# Patient Record
Sex: Female | Born: 1980 | Race: Black or African American | Hispanic: No | Marital: Married | Smoking: Never smoker
Health system: Southern US, Community
[De-identification: ages and names within clinical notes are randomized; demographics above are authoritative.]

## PROBLEM LIST (undated history)

## (undated) ENCOUNTER — Emergency Department (HOSPITAL_COMMUNITY): Admission: EM | Payer: Managed Care, Other (non HMO) | Source: Home / Self Care

## (undated) DIAGNOSIS — M549 Dorsalgia, unspecified: Secondary | ICD-10-CM

## (undated) DIAGNOSIS — D219 Benign neoplasm of connective and other soft tissue, unspecified: Secondary | ICD-10-CM

## (undated) DIAGNOSIS — Z789 Other specified health status: Secondary | ICD-10-CM

## (undated) HISTORY — DX: Benign neoplasm of connective and other soft tissue, unspecified: D21.9

---

## 2001-01-29 ENCOUNTER — Inpatient Hospital Stay (HOSPITAL_COMMUNITY): Admission: AD | Admit: 2001-01-29 | Discharge: 2001-01-31 | Payer: Self-pay | Admitting: *Deleted

## 2002-12-02 ENCOUNTER — Ambulatory Visit (HOSPITAL_COMMUNITY): Admission: RE | Admit: 2002-12-02 | Discharge: 2002-12-02 | Payer: Self-pay | Admitting: *Deleted

## 2003-01-12 ENCOUNTER — Ambulatory Visit (HOSPITAL_COMMUNITY): Admission: RE | Admit: 2003-01-12 | Discharge: 2003-01-12 | Payer: Self-pay | Admitting: *Deleted

## 2003-03-22 ENCOUNTER — Inpatient Hospital Stay (HOSPITAL_COMMUNITY): Admission: AD | Admit: 2003-03-22 | Discharge: 2003-03-24 | Payer: Self-pay | Admitting: *Deleted

## 2007-04-23 ENCOUNTER — Inpatient Hospital Stay (HOSPITAL_COMMUNITY): Admission: AD | Admit: 2007-04-23 | Discharge: 2007-04-23 | Payer: Self-pay | Admitting: Obstetrics & Gynecology

## 2007-04-26 ENCOUNTER — Inpatient Hospital Stay (HOSPITAL_COMMUNITY): Admission: AD | Admit: 2007-04-26 | Discharge: 2007-04-27 | Payer: Self-pay | Admitting: Obstetrics & Gynecology

## 2007-05-03 ENCOUNTER — Inpatient Hospital Stay (HOSPITAL_COMMUNITY): Admission: AD | Admit: 2007-05-03 | Discharge: 2007-05-03 | Payer: Self-pay | Admitting: Obstetrics & Gynecology

## 2007-07-04 ENCOUNTER — Encounter: Admission: RE | Admit: 2007-07-04 | Discharge: 2007-07-04 | Payer: Self-pay | Admitting: Sports Medicine

## 2007-08-06 ENCOUNTER — Ambulatory Visit (HOSPITAL_COMMUNITY): Admission: RE | Admit: 2007-08-06 | Discharge: 2007-08-06 | Payer: Self-pay | Admitting: Obstetrics & Gynecology

## 2007-08-07 ENCOUNTER — Encounter: Admission: RE | Admit: 2007-08-07 | Discharge: 2007-08-08 | Payer: Self-pay | Admitting: Obstetrics & Gynecology

## 2007-09-09 ENCOUNTER — Ambulatory Visit (HOSPITAL_COMMUNITY): Admission: RE | Admit: 2007-09-09 | Discharge: 2007-09-09 | Payer: Self-pay | Admitting: Obstetrics & Gynecology

## 2007-11-27 ENCOUNTER — Ambulatory Visit (HOSPITAL_COMMUNITY): Admission: RE | Admit: 2007-11-27 | Discharge: 2007-11-27 | Payer: Self-pay | Admitting: Obstetrics & Gynecology

## 2008-04-14 ENCOUNTER — Inpatient Hospital Stay (HOSPITAL_COMMUNITY): Admission: AD | Admit: 2008-04-14 | Discharge: 2008-04-18 | Payer: Self-pay | Admitting: Obstetrics

## 2008-04-15 ENCOUNTER — Encounter: Payer: Self-pay | Admitting: Obstetrics

## 2009-01-19 ENCOUNTER — Emergency Department (HOSPITAL_COMMUNITY): Admission: EM | Admit: 2009-01-19 | Discharge: 2009-01-19 | Payer: Self-pay | Admitting: Family Medicine

## 2009-01-19 ENCOUNTER — Emergency Department (HOSPITAL_COMMUNITY): Admission: EM | Admit: 2009-01-19 | Discharge: 2009-01-19 | Payer: Self-pay | Admitting: Emergency Medicine

## 2009-03-28 ENCOUNTER — Ambulatory Visit: Payer: Self-pay | Admitting: Internal Medicine

## 2009-03-30 ENCOUNTER — Ambulatory Visit (HOSPITAL_COMMUNITY): Admission: RE | Admit: 2009-03-30 | Discharge: 2009-03-30 | Payer: Self-pay | Admitting: Family Medicine

## 2009-04-20 ENCOUNTER — Encounter (INDEPENDENT_AMBULATORY_CARE_PROVIDER_SITE_OTHER): Payer: Self-pay | Admitting: Family Medicine

## 2009-04-20 ENCOUNTER — Ambulatory Visit: Payer: Self-pay | Admitting: Internal Medicine

## 2009-04-20 LAB — CONVERTED CEMR LAB: Sed Rate: 9 mm/hr (ref 0–22)

## 2009-05-19 ENCOUNTER — Ambulatory Visit: Payer: Self-pay | Admitting: Obstetrics and Gynecology

## 2009-05-26 ENCOUNTER — Ambulatory Visit (HOSPITAL_COMMUNITY): Admission: RE | Admit: 2009-05-26 | Discharge: 2009-05-26 | Payer: Self-pay | Admitting: Obstetrics and Gynecology

## 2009-07-21 ENCOUNTER — Ambulatory Visit: Payer: Self-pay | Admitting: Obstetrics and Gynecology

## 2009-11-10 ENCOUNTER — Encounter (INDEPENDENT_AMBULATORY_CARE_PROVIDER_SITE_OTHER): Payer: Self-pay | Admitting: *Deleted

## 2009-11-10 LAB — CONVERTED CEMR LAB
CO2: 24 meq/L (ref 19–32)
CRP: 0.2 mg/dL (ref <0.6)
Calcium: 10 mg/dL (ref 8.4–10.5)
Chloride: 104 meq/L (ref 96–112)
Creatinine, Ser: 0.69 mg/dL (ref 0.40–1.20)
Eosinophils Absolute: 0 10*3/uL (ref 0.0–0.7)
Eosinophils Relative: 1 % (ref 0–5)
Glucose, Bld: 90 mg/dL (ref 70–99)
HCT: 37.2 % (ref 36.0–46.0)
Lymphs Abs: 1.3 10*3/uL (ref 0.7–4.0)
MCV: 82.9 fL (ref 78.0–100.0)
Monocytes Relative: 11 % (ref 3–12)
RBC: 4.49 M/uL (ref 3.87–5.11)
Sodium: 137 meq/L (ref 135–145)
Total Bilirubin: 0.3 mg/dL (ref 0.3–1.2)
Total Protein: 7.4 g/dL (ref 6.0–8.3)
WBC: 3.4 10*3/uL — ABNORMAL LOW (ref 4.0–10.5)

## 2010-03-03 ENCOUNTER — Ambulatory Visit (HOSPITAL_COMMUNITY)
Admission: RE | Admit: 2010-03-03 | Discharge: 2010-03-03 | Payer: Self-pay | Source: Home / Self Care | Attending: Family Medicine | Admitting: Family Medicine

## 2010-03-05 ENCOUNTER — Encounter: Payer: Self-pay | Admitting: Obstetrics & Gynecology

## 2010-03-06 ENCOUNTER — Encounter: Payer: Self-pay | Admitting: Obstetrics & Gynecology

## 2010-05-16 LAB — COMPREHENSIVE METABOLIC PANEL
BUN: 13 mg/dL (ref 6–23)
CO2: 26 mEq/L (ref 19–32)
Calcium: 9.3 mg/dL (ref 8.4–10.5)
Creatinine, Ser: 0.56 mg/dL (ref 0.4–1.2)
GFR calc non Af Amer: >60 mL/min (ref >60)
Glucose, Bld: 86 mg/dL (ref 70–99)
Total Protein: 7.5 g/dL (ref 6.0–8.3)

## 2010-05-16 LAB — CBC
HCT: 37.8 % (ref 36.0–46.0)
Hemoglobin: 13.2 g/dL (ref 12.0–15.0)
MCHC: 35 g/dL (ref 30.0–36.0)
MCV: 88.2 fL (ref 78.0–100.0)
RBC: 4.28 MIL/uL (ref 3.87–5.11)
RDW: 12.7 % (ref 11.5–15.5)

## 2010-05-16 LAB — POCT URINALYSIS DIP (DEVICE)
Glucose, UA: NEGATIVE mg/dL
Nitrite: NEGATIVE
Specific Gravity, Urine: 1.025 (ref 1.005–1.030)
Urobilinogen, UA: 0.2 mg/dL (ref 0.0–1.0)

## 2010-05-16 LAB — DIFFERENTIAL
Eosinophils Absolute: 0 10*3/uL (ref 0.0–0.7)
Lymphs Abs: 1.5 10*3/uL (ref 0.7–4.0)
Monocytes Relative: 11 % (ref 3–12)
Neutro Abs: 1.7 10*3/uL (ref 1.7–7.7)
Neutrophils Relative %: 46 % (ref 43–77)

## 2010-05-16 LAB — WET PREP, GENITAL: Trich, Wet Prep: NONE SEEN

## 2010-05-16 LAB — GC/CHLAMYDIA PROBE AMP, GENITAL
Chlamydia, DNA Probe: NEGATIVE
GC Probe Amp, Genital: NEGATIVE

## 2010-05-16 LAB — URINALYSIS, ROUTINE W REFLEX MICROSCOPIC
Bilirubin Urine: NEGATIVE
Hgb urine dipstick: NEGATIVE
Urobilinogen, UA: 0.2 mg/dL (ref 0.0–1.0)

## 2010-05-16 LAB — URINE CULTURE

## 2010-05-16 LAB — LIPASE, BLOOD: Lipase: 16 U/L (ref 11–59)

## 2010-05-16 LAB — POCT PREGNANCY, URINE: Preg Test, Ur: NEGATIVE

## 2010-05-25 LAB — CBC
Hemoglobin: 11.1 g/dL — ABNORMAL LOW (ref 12.0–15.0)
MCV: 91.2 fL (ref 78.0–100.0)
Platelets: 135 10*3/uL — ABNORMAL LOW (ref 150–400)
RBC: 2.69 MIL/uL — ABNORMAL LOW (ref 3.87–5.11)
RDW: 13.9 % (ref 11.5–15.5)
WBC: 6.1 10*3/uL (ref 4.0–10.5)

## 2010-06-27 NOTE — Op Note (Signed)
[name], Walls                ACCOUNT NO.:  [id number]   MEDICAL RECORD NO.:  [id number]          PATIENT TYPE:  INP   LOCATION:  9128                          FACILITY:  [facility]   PHYSICIAN:  Charles A. Clearance Coots, M.D.DATE OF BIRTH:  05-16-1980   DATE OF PROCEDURE:  04/14/2008  DATE OF DISCHARGE:                               OPERATIVE REPORT   PREOPERATIVE DIAGNOSIS:  Arrest of descent, persistent occiput  posterior, severe variables.   POSTOPERATIVE DIAGNOSIS:  Arrest of descent, persistent occiput  posterior, severe variables.   PROCEDURE:  Primary low transverse cesarean section.   SURGEON:  Charles A. Clearance Coots, MD   ANESTHESIA:  Epidural.   ESTIMATED BLOOD LOSS:  1000 mL.   IV FLUIDS:  2100 mL.   URINE OUTPUT:  175 mL.   COMPLICATIONS:  None.   Foley to gravity.   FINDINGS:  Viable female at 0258, Apgars of 9 at 1 minute and 9 at 5  minutes, weight of 7 pounds and 3 ounces.  Normal uterus, ovaries, and  fallopian tubes.   OPERATION:  The patient was brought to the operating room after  satisfactory redosing of the epidural.  The abdomen was prepped and  draped in the usual sterile fashion.  Pfannenstiel skin incision was  made with a scalpel that was deepened down to the fascia with a scalpel.  The fascia was nicked in the midline and the fascial incision was  extended to left and to a right with curved Mayo scissors.  The superior  and inferior fascial edges were taken off the rectus muscle both blunt  and sharp dissection.  The rectus muscle was bluntly divided in the  midline.  Peritoneum was entered digitally and was digitally extended to  the left and to right.  The bladder blade was positioned in the  incision.  The vesicouterine fold of peritoneum above the reflection of  the urinary bladder was grasped with forceps and was incised and  undermined with Metzenbaum scissors.  The incision was extended to the  left and to the right with the Metzenbaum scissors.   The bladder flap  was bluntly developed and the bladder blade was repositioned in front of  the urinary bladder placing well out of operative field.  The uterus was  entered transversely in the lower uterine segment with a scalpel.  Clear  amniotic fluid was expelled.  The uterine incision was extended to the  left and to the right digitally.  The occiput was noted to be right  occiput posterior and the occiput was rotated anteriorly into the  incision and the delivery was accomplished with the aid of fundal  pressure from the assistant.  The infant's mouth and nose was suctioned  with a suction bulb and the delivery was completed with the aid of  fundal pressure from the assistant.  The umbilical cord was doubly  clamped and cut and infant was handed off to the nursery staff.  Cord  blood was obtained and the placenta was spontaneously expelled from the  uterine cavity intact.  The endometrial surface was thoroughly debrided  with a dry lap sponge.  The edges of the uterine incision were grasped  with ring forceps.  The uterus was closed in 2 layers, the first layer  was closed with a continuous interlocking suture of 0 Monocryl from each  corner to the center, second layer was closed with a continuous  imbricating suture of 0 Monocryl.  Hemostasis was excellent.  The pelvic  cavity was thoroughly irrigated with warm saline solution and all clots  were removed.  The abdomen was then closed as follows.  The parietal  peritoneum was closed with continuous suture of 2-0 Vicryl.  The fascia  was closed with continuous suture of 0 Vicryl.  Subcutaneous tissue was  thoroughly irrigated with warm saline solution and all areas of  subcutaneous bleeding were coagulated with the Bovie.  Skin was then  closed with surgical stainless steel staples.  Sterile bandage was  applied to the incision closure.  Surgical technician indicated that all  needle, sponge, and instrument counts were correct x2.   The patient  tolerated the procedure well, transported to the recovery room in  satisfactory condition.      Charles A. Clearance Coots, M.D.  Electronically Signed     CAH/MEDQ  D:  04/15/2008  T:  04/15/2008  Job:  [id number]

## 2010-06-27 NOTE — Discharge Summary (Signed)
[name], Walls                ACCOUNT NO.:  [id number]   MEDICAL RECORD NO.:  [id number]          PATIENT TYPE:  INP   LOCATION:  9128                          FACILITY:  [facility]   PHYSICIAN:  Charles A. Clearance Coots, M.D.DATE OF BIRTH:  October 23, 1980   DATE OF ADMISSION:  04/14/2008  DATE OF DISCHARGE:  04/18/2008                               DISCHARGE SUMMARY   ADMITTING DIAGNOSES:  1. 39 weeks' gestation.  2. Premature rupture of membranes.  3. Induction of labor.   DISCHARGE DIAGNOSES:  1. 39 weeks' gestation.  2. Premature rupture of membranes.  3. Induction of labor.  4. Status post primary low transverse cesarean section on April 15, 2008, for arrest of descent, persistent occiput posterior position,      and severe variable fetal heart rate deceleration.  Viable female      delivered at 0258, Apgars of 9 at 1 minute and 9 at 5 minutes,      weight of 7 pounds and 3 ounces.  Mother and infant discharged home      in good condition.   REASON FOR ADMISSION:  A 30 year old G5, P3, estimated date of  confinement of April 20, 2008, presents with complaint of leaking fluid  which was clear.  The patient had no uterine contractions.  Prenatal  care was remarkable for positive group B strep.   PAST MEDICAL HISTORY:  Surgery; none.  Illnesses; none.   MEDICATIONS:  Prenatal vitamins.   ALLERGIES:  No known drug allergies   SOCIAL HISTORY:  Married.  Negative tobacco, alcohol, or recreational  drug use.   FAMILY HISTORY:  Positive for cardiovascular disease.   PHYSICAL EXAMINATION:  GENERAL:  Well-nourished, well-developed female  in no acute distress.  VITAL SIGNS:  Afebrile, vital signs were stable.  LUNGS:  Clear to auscultation bilaterally.  HEART:  Regular rate and rhythm.  ABDOMEN:  Gravid, nontender.  Cervix 1 cm dilated, 50% effaced, and  vertex -2 station.   ADMITTING LABORATORY DATA:  Hemoglobin 11.1, hematocrit 32.5, white  blood cell count 4700, platelets  135,000.   HOSPITAL COURSE:  The patient was admitted and started on Pitocin for  induction of labor.  She progressed to full dilatation.  The vertex at a  0 station and occiput posterior position.  The patient made no further  descent of the vertex for greater than 3 hours with adequate labor.  The  fetal heart rate pattern also revealed severe variable fetal heart rate  decelerations.  A decision was made to proceed with cesarean section  delivery for arrest of descent and persistent occiput posterior position  and severe variable fetal heart rate decels.  Primary low transverse  cesarean section was performed on April 15, 2008.  There were no  intraoperative complications.  Postoperative course was complicated by  anemia.  The patient had no significant clinical hemodynamic adverse  changes and did not need transfusion of blood products and therefore  started on iron therapy to be continued postpartum.  The patient did  well postoperatively and was discharged home on postop  day #3 in good  condition.   DISCHARGE LABORATORY DATA:  Hemoglobin 8.5, hematocrit 24.5, white blood  cell count 6100, platelets 109,000.   DISCHARGE DISPOSITION:  Medications; Percocet and ibuprofen was  prescribed for pain.  Continue prenatal vitamins.  Iron was prescribed  for anemia.  Routine written instructions were given for discharge after  cesarean section.  The patient is to call office for followup  appointment in 2 weeks.      Charles A. Clearance Coots, M.D.  Electronically Signed     CAH/MEDQ  D:  05/24/2008  T:  05/24/2008  Job:  [id number]

## 2010-11-06 LAB — POCT PREGNANCY, URINE
Operator id: 12087
Preg Test, Ur: POSITIVE

## 2010-11-06 LAB — HCG, QUANTITATIVE, PREGNANCY: hCG, Beta Chain, Quant, S: 227 — ABNORMAL HIGH

## 2010-11-06 LAB — WET PREP, GENITAL
Trich, Wet Prep: NONE SEEN
Yeast Wet Prep HPF POC: NONE SEEN

## 2011-02-23 ENCOUNTER — Emergency Department (HOSPITAL_COMMUNITY)
Admission: EM | Admit: 2011-02-23 | Discharge: 2011-02-23 | Disposition: A | Discharge status: Home / Self Care | Payer: Self-pay | Source: Home / Self Care | Attending: Emergency Medicine | Admitting: Emergency Medicine

## 2011-02-23 ENCOUNTER — Encounter (HOSPITAL_COMMUNITY): Payer: Self-pay

## 2011-02-23 DIAGNOSIS — M549 Dorsalgia, unspecified: Secondary | ICD-10-CM

## 2011-02-23 DIAGNOSIS — G8929 Other chronic pain: Secondary | ICD-10-CM

## 2011-02-23 HISTORY — DX: Dorsalgia, unspecified: M54.9

## 2011-02-23 LAB — POCT URINALYSIS DIP (DEVICE)
Bilirubin Urine: NEGATIVE
Glucose, UA: NEGATIVE mg/dL
Hgb urine dipstick: NEGATIVE
Specific Gravity, Urine: >=1.03 (ref 1.005–1.030)
Urobilinogen, UA: 0.2 mg/dL (ref 0.0–1.0)

## 2011-02-23 MED ORDER — TRAMADOL HCL 50 MG PO TABS: 50.0000 mg | ORAL_TABLET | Freq: Four times a day (QID) | ORAL | Status: AC | PRN

## 2011-02-23 MED ORDER — MELOXICAM 7.5 MG PO TABS: 7.5000 mg | ORAL_TABLET | Freq: Every day | ORAL | Status: DC

## 2011-02-23 NOTE — ED Provider Notes (Signed)
History     CSN: [id number]  Arrival date & time 02/23/11  1241   First MD Initiated Contact with Patient 02/23/11 1321      Chief Complaint  Patient presents with  . Back Pain    (Consider location/radiation/quality/duration/timing/severity/associated sxs/prior treatment) HPI Comments: Low back pain  that radiates to left leg" going on for 5 years, "on and off" No weakness. No numbness or tingling , does describe shooting pains shoots down to leg" No weight loss No fevers  Have not been using contraception, also is concerned she might be pregnant"  Patient is a 31 y.o. female presenting with back pain.  Back Pain  This is a chronic problem. The problem occurs constantly. The problem has not changed since onset.The pain is associated with no known injury. The pain is present in the lumbar spine. The pain does not radiate. The pain is at a severity of 8/10. The symptoms are aggravated by bending and certain positions. Pertinent negatives include no chest pain, no fever, no numbness, no weight loss, no headaches, no abdominal pain, no abdominal swelling, no bowel incontinence, no perianal numbness, no bladder incontinence, no dysuria, no pelvic pain, no paresthesias and no weakness. She has tried heat for the symptoms. The treatment provided no relief.    Past Medical History  Diagnosis Date  . Back pain     Past Surgical History  Procedure Date  . Cesarean section     History reviewed. No pertinent family history.  History  Substance Use Topics  . Smoking status: Not on file  . Smokeless tobacco: Not on file  . Alcohol Use:     OB History    Grav Para Term Preterm Abortions TAB SAB Ect Mult Living                  Review of Systems  Constitutional: Negative for fever, chills, weight loss and appetite change.  HENT: Negative for neck stiffness.   Respiratory: Negative for shortness of breath.   Cardiovascular: Negative for chest pain.  Gastrointestinal: Negative  for abdominal pain and bowel incontinence.  Genitourinary: Negative for bladder incontinence, dysuria, frequency, flank pain, vaginal bleeding, difficulty urinating and pelvic pain.  Musculoskeletal: Positive for back pain. Negative for joint swelling.  Neurological: Negative for weakness, numbness, headaches and paresthesias.    Allergies  Review of patient's allergies indicates no known allergies.  Home Medications  No current outpatient prescriptions on file.  BP 116/81  Pulse 80  Temp(Src) 98.1 F (36.7 C) (Oral)  Resp 18  SpO2 100%  LMP 02/07/2011  Physical Exam  Nursing note and vitals reviewed. Constitutional: She appears well-developed and well-nourished. No distress.  Musculoskeletal: She exhibits tenderness.       Lumbar back: She exhibits decreased range of motion, tenderness, bony tenderness and pain. She exhibits no edema, no deformity and no laceration.       Back:  Neurological: She is alert.  Skin: No rash noted. No erythema.    ED Course  Procedures (including critical care time)  Labs Reviewed  POCT URINALYSIS DIP (DEVICE) - Abnormal; Notable for the following:    Leukocytes, UA TRACE (*) Biochemical Testing Only. Please order routine urinalysis from main lab if confirmatory testing is needed.   All other components within normal limits  POCT PREGNANCY, URINE  POCT PREGNANCY, URINE  POCT URINALYSIS DIPSTICK   No results found.   No diagnosis found.    MDM  Chronic back pain x 5 years- MRI  2011 by [facility]- no neurological or surgical pathology.  Has not followed up. No fevers, exam- was not indicative of cauda equina syndrome, or malignancy of infectious stigmata.        Jimmie Molly, MD 02/23/11 [phone]

## 2011-02-23 NOTE — ED Notes (Signed)
C/o of lt sided mid and low back pain that radiates down the left leg.  States she has had sx for 5 years but worse recently and more constant.  States she has hx of "disc problems".

## 2011-06-18 ENCOUNTER — Other Ambulatory Visit: Payer: Self-pay

## 2011-07-11 ENCOUNTER — Other Ambulatory Visit: Payer: Self-pay | Admitting: Nurse Practitioner

## 2011-07-25 ENCOUNTER — Other Ambulatory Visit: Payer: Self-pay | Admitting: Nurse Practitioner

## 2011-07-25 ENCOUNTER — Ambulatory Visit (HOSPITAL_COMMUNITY)
Admission: RE | Admit: 2011-07-25 | Discharge: 2011-07-25 | Disposition: A | Discharge status: Home / Self Care | Payer: Medicaid Other | Source: Ambulatory Visit | Attending: Nurse Practitioner | Admitting: Nurse Practitioner

## 2011-07-25 ENCOUNTER — Encounter (HOSPITAL_COMMUNITY): Payer: Self-pay

## 2011-07-25 DIAGNOSIS — Z1389 Encounter for screening for other disorder: Secondary | ICD-10-CM | POA: Insufficient documentation

## 2011-07-25 DIAGNOSIS — O30009 Twin pregnancy, unspecified number of placenta and unspecified number of amniotic sacs, unspecified trimester: Secondary | ICD-10-CM

## 2011-07-25 DIAGNOSIS — Z09 Encounter for follow-up examination after completed treatment for conditions other than malignant neoplasm: Secondary | ICD-10-CM

## 2011-07-25 DIAGNOSIS — O30002 Twin pregnancy, unspecified number of placenta and unspecified number of amniotic sacs, second trimester: Secondary | ICD-10-CM

## 2011-07-25 DIAGNOSIS — O34219 Maternal care for unspecified type scar from previous cesarean delivery: Secondary | ICD-10-CM | POA: Insufficient documentation

## 2011-07-25 DIAGNOSIS — Z363 Encounter for antenatal screening for malformations: Secondary | ICD-10-CM | POA: Insufficient documentation

## 2011-07-25 DIAGNOSIS — O358XX0 Maternal care for other (suspected) fetal abnormality and damage, not applicable or unspecified: Secondary | ICD-10-CM | POA: Insufficient documentation

## 2011-07-25 DIAGNOSIS — R011 Cardiac murmur, unspecified: Secondary | ICD-10-CM

## 2011-07-25 NOTE — Progress Notes (Signed)
MFM note  [name] is a 31 yo [id number] currently at 71 4/7 weeks with DC/DA twins who is referred for ultrasound and evaluation due to newly diagnosed "heart murmur".  She reports that she has never been told in the past of any heart murmurs or abnormalities.  She reports frequent episodes of palpiations, but denies chest pain, shortness of breath, or orthopnea. She is able to go about her normal/ daily activity without symptoms.  She is without complaints today.  POB hx Term C-section for NR fetal tracing (2010)  Early SAB at 6 weeks (2009)  Term SVD without complications in 1999, 2002, 2005  PMH - neg  PSH- C-section  Meds - Prenatal vitamins  Social - non drinker, non smoker, denies illicit drug use  ROS non contributory except as per HPI  VS: Wt: 172#, BP 101/65, pulse 100 WDWN gravid female in NAD Lungs - CTA Cor - RRR, grade II/VI systolic ejection murmur  Ultrasound: DC/DA twin gestation with best dates of 16 4/7 weeks.  A thick dividing membrane is noted with a twin peak sign.  Growth is appropriate and concordant. Normal amniotic fluid volume x 2.  A: breech, appears to be female, right/ lateral placenta.  A velamentous cord insertion is noted.  Limited views of the fetal heart were obtained due to early gestational age.  B: transverse, femaile, posterior placenta.  A margianl cord instertion noted.  Limited views of the heart and spine were obtained.  Assessment: 1) DC/DA twin gestation at 29 4/7 weeks 2) Systolic murmur - likely innocent flow murmur  Plan: 1) Recommend checking TSH/ free T4 secondary to "palpitations" 2) Cardiology consult for maternal echocardiogram and possible Holter monitor due to palpitations - feel that the murmur is most likely non-pathologic, but given immigrant status would recommend work up to rule out Rheumatic heard disease 3) Follow up ultrasound for growth and to reevaluate fetal anatomy in 4 weeks.  Thank you for the opportunity to  participate in this patient's care.  Please do not hesitate to contact our office if there are any further questions or concerns.  Alpha Gula, MD  I spent approximately 30 minutes with this patient with over 50% of time spent in face-to-face counseling.

## 2011-08-10 ENCOUNTER — Encounter (HOSPITAL_COMMUNITY): Payer: Self-pay

## 2011-08-10 ENCOUNTER — Other Ambulatory Visit (HOSPITAL_COMMUNITY): Payer: Self-pay

## 2011-08-14 ENCOUNTER — Other Ambulatory Visit (HOSPITAL_COMMUNITY): Payer: Self-pay

## 2011-08-15 ENCOUNTER — Encounter (HOSPITAL_COMMUNITY): Payer: Self-pay

## 2011-08-15 ENCOUNTER — Other Ambulatory Visit (HOSPITAL_COMMUNITY): Payer: Self-pay

## 2011-08-21 ENCOUNTER — Encounter (HOSPITAL_COMMUNITY): Payer: Self-pay

## 2011-08-21 ENCOUNTER — Ambulatory Visit (HOSPITAL_COMMUNITY)
Admission: RE | Admit: 2011-08-21 | Discharge: 2011-08-21 | Disposition: A | Discharge status: Home / Self Care | Payer: Medicaid Other | Source: Ambulatory Visit | Attending: Nurse Practitioner | Admitting: Nurse Practitioner

## 2011-08-21 VITALS — BP 108/66 | HR 108 | Wt 176.0 lb

## 2011-08-21 DIAGNOSIS — O30009 Twin pregnancy, unspecified number of placenta and unspecified number of amniotic sacs, unspecified trimester: Secondary | ICD-10-CM | POA: Insufficient documentation

## 2011-08-21 DIAGNOSIS — O30002 Twin pregnancy, unspecified number of placenta and unspecified number of amniotic sacs, second trimester: Secondary | ICD-10-CM

## 2011-08-21 DIAGNOSIS — Z3689 Encounter for other specified antenatal screening: Secondary | ICD-10-CM | POA: Insufficient documentation

## 2011-08-21 NOTE — Progress Notes (Signed)
Patient seen today  for follow up ultrasound.  See full report in [facility].  Alpha Gula, MD  DC/DA twin gestation with best dates of 20 3/7 weeks.  A thick dividing membrane is noted with a twin peak sign.  Growth is appropriate and concordant. Normal amniotic fluid volume x 2.  A: variable presentation, appears to be female, right/ lateral placenta.  A velamentous cord insertion is noted.  Interval anatomy within normal limits. Interval growth appropriate (51st %tile)  B: transverse, femaile, posterior placenta.  A margianl cord instertion noted.  Interval anatomy is within normal limits. Fetal growth is appropriate (55th %tile)  Recommend follow up ultrasound in 4 weeks for interval growth.

## 2011-09-19 ENCOUNTER — Ambulatory Visit (HOSPITAL_COMMUNITY)
Admission: RE | Admit: 2011-09-19 | Discharge: 2011-09-19 | Disposition: A | Discharge status: Home / Self Care | Payer: Medicaid Other | Source: Ambulatory Visit | Attending: Nurse Practitioner | Admitting: Nurse Practitioner

## 2011-09-19 ENCOUNTER — Encounter (HOSPITAL_COMMUNITY): Payer: Self-pay

## 2011-09-19 VITALS — BP 94/38 | HR 64 | Wt 180.0 lb

## 2011-09-19 DIAGNOSIS — Z3689 Encounter for other specified antenatal screening: Secondary | ICD-10-CM | POA: Insufficient documentation

## 2011-09-19 DIAGNOSIS — O30009 Twin pregnancy, unspecified number of placenta and unspecified number of amniotic sacs, unspecified trimester: Secondary | ICD-10-CM | POA: Insufficient documentation

## 2011-09-19 DIAGNOSIS — O30002 Twin pregnancy, unspecified number of placenta and unspecified number of amniotic sacs, second trimester: Secondary | ICD-10-CM

## 2011-09-19 NOTE — Progress Notes (Signed)
Patient seen today  for follow up ultrasound.  See full report in [facility].  Alpha Gula, MD  DC/DA twin gestation with best dates of 24 4/7 weeks.  A thick dividing membrane is noted. Overall growth is appropriate.  A 19% growth discordance is appreciated with A being the smaller fetus. Normal amniotic fluid volume x 2.  A: transverse, fetal head on right, appears to be female.  A velamentous cord insertion is noted.   Interval growth appropriate (40th %tile)  B: transverse, fetal head on left, femaile, posterior placenta.  A margianl cord instertion noted.  Fetal growth is appropriate (68th %tile)  Recommend follow up ultrasound in 4 weeks for interval growth.

## 2011-10-16 ENCOUNTER — Ambulatory Visit (HOSPITAL_COMMUNITY)
Admission: RE | Admit: 2011-10-16 | Discharge: 2011-10-16 | Disposition: A | Discharge status: Home / Self Care | Payer: Medicaid Other | Source: Ambulatory Visit | Attending: Nurse Practitioner | Admitting: Nurse Practitioner

## 2011-10-16 VITALS — BP 97/64 | HR 95 | Wt 184.5 lb

## 2011-10-16 DIAGNOSIS — Z3689 Encounter for other specified antenatal screening: Secondary | ICD-10-CM | POA: Insufficient documentation

## 2011-10-16 DIAGNOSIS — O30009 Twin pregnancy, unspecified number of placenta and unspecified number of amniotic sacs, unspecified trimester: Secondary | ICD-10-CM | POA: Insufficient documentation

## 2011-10-16 DIAGNOSIS — O30002 Twin pregnancy, unspecified number of placenta and unspecified number of amniotic sacs, second trimester: Secondary | ICD-10-CM

## 2011-11-13 ENCOUNTER — Ambulatory Visit (HOSPITAL_COMMUNITY)
Admission: RE | Admit: 2011-11-13 | Discharge: 2011-11-13 | Disposition: A | Discharge status: Home / Self Care | Payer: Medicaid Other | Source: Ambulatory Visit | Attending: Nurse Practitioner | Admitting: Nurse Practitioner

## 2011-11-13 DIAGNOSIS — O30009 Twin pregnancy, unspecified number of placenta and unspecified number of amniotic sacs, unspecified trimester: Secondary | ICD-10-CM | POA: Insufficient documentation

## 2011-11-13 DIAGNOSIS — O30002 Twin pregnancy, unspecified number of placenta and unspecified number of amniotic sacs, second trimester: Secondary | ICD-10-CM

## 2011-11-13 DIAGNOSIS — O34219 Maternal care for unspecified type scar from previous cesarean delivery: Secondary | ICD-10-CM | POA: Insufficient documentation

## 2011-11-13 NOTE — Progress Notes (Signed)
[name] had an ultrasound appointment today.  Please see [facility] report for details.   Impression Active dichorionic diamniotic twins.  No apparent dysmorphic features are identified in either fetus on limited anatomic re-examination.  The estimated fetal weights are at the 43rd percentile for twin 1 and at the 76th percentile for twin 2, respectively. This represents a 17% difference (<20%) so their growth is concordant. The amniotic fluid volume is normal for Twin A and polyhydramnios for Twin B.  Recommendations 1. As a courtesy, limited ultrasounds were scheduled for your patient weekly (every Tuesday) in our unit to assess amniotic fluid volume and NST to facilitate initiation of twice weekly NST.   2. An NST should be scheduled at your office on Fridays to complete the twice weekly NST testing protocol.  Your office was called to arrange this schedule. 3.  Interval growth will continue to be assessed every 3-4 weeks.   Rogelia Boga, MD, MS, FACOG Assistant Professor Section of Maternal-Fetal Medicine [facility]

## 2011-11-14 ENCOUNTER — Ambulatory Visit (HOSPITAL_COMMUNITY): Payer: Medicaid Other

## 2011-11-15 ENCOUNTER — Other Ambulatory Visit: Payer: Self-pay | Admitting: Obstetrics & Gynecology

## 2011-11-15 DIAGNOSIS — O30009 Twin pregnancy, unspecified number of placenta and unspecified number of amniotic sacs, unspecified trimester: Secondary | ICD-10-CM

## 2011-11-15 DIAGNOSIS — O34219 Maternal care for unspecified type scar from previous cesarean delivery: Secondary | ICD-10-CM

## 2011-11-19 ENCOUNTER — Ambulatory Visit (HOSPITAL_COMMUNITY)
Admission: RE | Admit: 2011-11-19 | Discharge: 2011-11-19 | Disposition: A | Discharge status: Home / Self Care | Payer: Medicaid Other | Source: Ambulatory Visit | Attending: Nurse Practitioner | Admitting: Nurse Practitioner

## 2011-11-19 ENCOUNTER — Ambulatory Visit (HOSPITAL_COMMUNITY)
Admission: RE | Admit: 2011-11-19 | Discharge: 2011-11-19 | Disposition: A | Discharge status: Home / Self Care | Payer: Medicaid Other | Source: Ambulatory Visit | Attending: Obstetrics & Gynecology | Admitting: Obstetrics & Gynecology

## 2011-11-19 DIAGNOSIS — O409XX0 Polyhydramnios, unspecified trimester, not applicable or unspecified: Secondary | ICD-10-CM | POA: Insufficient documentation

## 2011-11-19 DIAGNOSIS — O30009 Twin pregnancy, unspecified number of placenta and unspecified number of amniotic sacs, unspecified trimester: Secondary | ICD-10-CM | POA: Insufficient documentation

## 2011-11-19 DIAGNOSIS — O34219 Maternal care for unspecified type scar from previous cesarean delivery: Secondary | ICD-10-CM

## 2011-11-20 ENCOUNTER — Ambulatory Visit (HOSPITAL_COMMUNITY): Payer: Medicaid Other

## 2011-11-20 ENCOUNTER — Other Ambulatory Visit (HOSPITAL_COMMUNITY): Payer: Medicaid Other

## 2011-11-22 ENCOUNTER — Inpatient Hospital Stay (HOSPITAL_COMMUNITY)
Admission: AD | Admit: 2011-11-22 | Discharge: 2011-11-22 | Disposition: A | Discharge status: Home / Self Care | Payer: Medicaid Other | Source: Ambulatory Visit | Attending: Obstetrics | Admitting: Obstetrics

## 2011-11-22 ENCOUNTER — Encounter (HOSPITAL_COMMUNITY): Payer: Self-pay | Admitting: *Deleted

## 2011-11-22 DIAGNOSIS — O4700 False labor before 37 completed weeks of gestation, unspecified trimester: Secondary | ICD-10-CM | POA: Diagnosis present

## 2011-11-22 HISTORY — DX: Other specified health status: Z78.9

## 2011-11-22 LAB — URINE MICROSCOPIC-ADD ON

## 2011-11-22 LAB — URINALYSIS, ROUTINE W REFLEX MICROSCOPIC
Bilirubin Urine: NEGATIVE
Glucose, UA: NEGATIVE mg/dL
Ketones, ur: NEGATIVE mg/dL
Specific Gravity, Urine: 1.02 (ref 1.005–1.030)
pH: 6.5 (ref 5.0–8.0)

## 2011-11-22 MED ORDER — LACTATED RINGERS IV BOLUS (SEPSIS)
1000.0000 mL | Freq: Once | INTRAVENOUS | Status: AC
  Administered 2011-11-22: 1000 mL via INTRAVENOUS

## 2011-11-22 MED ORDER — BETAMETHASONE SOD PHOS & ACET 6 (3-3) MG/ML IJ SUSP
12.0000 mg | INTRAMUSCULAR | Status: AC
  Administered 2011-11-22: 12 mg via INTRAMUSCULAR
  Filled 2011-11-22: qty 2

## 2011-11-22 MED ORDER — TERBUTALINE SULFATE 1 MG/ML IJ SOLN
0.2500 mg | INTRAMUSCULAR | Status: AC
  Administered 2011-11-22: 0.25 mg via SUBCUTANEOUS
  Filled 2011-11-22: qty 1

## 2011-11-22 NOTE — MAU Provider Note (Signed)
Chief Complaint:  Laboring   None     HPI: Carol Walls is a 31 y.o. [phone] at 16w5dwho presents to maternity admissions because she was sent over from the office to be treated with medication for her contractions.  She was seen for a normal OB appointment and NST in the office today and contractions were seen on the monitor.  She reports that her cervix was checked for dilation today in the office and she was closed. She does report some intermittent back pain, but denies regular cramping.  She reports good fetal movement, denies LOF, vaginal bleeding, vaginal itching/burning, urinary symptoms, h/a, dizziness, n/v, or fever/chills.     Past Medical History: Past Medical History  Diagnosis Date  . Back pain   . No pertinent past medical history     Past obstetric history:   Past Surgical History: Past Surgical History  Procedure Date  . Cesarean section     Family History: History reviewed. No pertinent family history.  Social History: History  Substance Use Topics  . Smoking status: Never Smoker   . Smokeless tobacco: Not on file  . Alcohol Use: No    Allergies: No Known Allergies  Meds:  Prescriptions prior to admission  Medication Sig Dispense Refill  . Prenatal Vit-Fe Fumarate-FA (PRENATAL MULTIVITAMIN) TABS Take 1 tablet by mouth daily.        ROS: Pertinent findings in history of present illness.  Physical Exam  Blood pressure 107/74, pulse 96, temperature 96.9 F (36.1 C), temperature source Oral, resp. rate 20, last menstrual period 03/31/2011. GENERAL: Well-developed, well-nourished female in no acute distress.  HEENT: normocephalic HEART: normal rate RESP: normal effort ABDOMEN: Soft, non-tender, gravid appropriate for gestational age EXTREMITIES: Nontender, no edema NEURO: alert and oriented SPECULUM EXAM: Deferred   Dilation: 3 Effacement (%): 50 Cervical Position: Posterior Station: -3 Presentation: Vertex Exam by:: L. Leftwich-Kirby  CNM  FHT Baby A: Baseline 125 , moderate variability, accelerations present, no decelerations Baby B: Baseline 130 , moderate variability, accelerations present, no decelerations Contractions: lasting 40-70 sec, q 3-5 mins   Labs: Results for orders placed during the hospital encounter of 11/22/11 (from the past 24 hour(s))  URINALYSIS, ROUTINE W REFLEX MICROSCOPIC     Status: Abnormal   Collection Time   11/22/11  4:35 PM      Component Value Range   Color, Urine YELLOW  YELLOW   APPearance HAZY (*) CLEAR   Specific Gravity, Urine 1.020  1.005 - 1.030   pH 6.5  5.0 - 8.0   Glucose, UA NEGATIVE  NEGATIVE mg/dL   Hgb urine dipstick TRACE (*) NEGATIVE   Bilirubin Urine NEGATIVE  NEGATIVE   Ketones, ur NEGATIVE  NEGATIVE mg/dL   Protein, ur NEGATIVE  NEGATIVE mg/dL   Urobilinogen, UA 0.2  0.0 - 1.0 mg/dL   Nitrite NEGATIVE  NEGATIVE   Leukocytes, UA SMALL (*) NEGATIVE  URINE MICROSCOPIC-ADD ON     Status: Abnormal   Collection Time   11/22/11  4:35 PM      Component Value Range   Squamous Epithelial / LPF MANY (*) RARE   WBC, UA 0-2  <3 WBC/hpf   RBC / HPF 0-2  <3 RBC/hpf   Bacteria, UA FEW (*) RARE     Assessment: 1. Threatened preterm labor     Plan: LR 1000 ml IV x1 dose, terbutaline 0.25mg  SQ x1 dose in MAU--with resolution of contractions after fluids/medication Called Dr Clearance Coots to discuss assessment and findings Betamethasone  x1 dose in MAU D/C home with PTL precautions F/U in Dr Marcia Brash office tomorrow after 2pm for second betamethasone and further eval Return to MAU as needed     Medication List     As of 11/22/2011  5:42 PM    ASK your doctor about these medications         prenatal multivitamin Tabs   Take 1 tablet by mouth daily.        Sharen Counter Certified Nurse-Midwife 11/22/2011 5:42 PM

## 2011-11-22 NOTE — MAU Note (Signed)
Pt sent from MD office, twins, having uc's on EFM in office.  Pt denies bleeding or LOF.

## 2011-11-23 ENCOUNTER — Inpatient Hospital Stay (HOSPITAL_COMMUNITY)
Admission: AD | Admit: 2011-11-23 | Discharge: 2011-11-23 | Disposition: A | Discharge status: Home / Self Care | Payer: Medicaid Other | Source: Ambulatory Visit | Attending: Obstetrics & Gynecology | Admitting: Obstetrics & Gynecology

## 2011-11-23 DIAGNOSIS — O4700 False labor before 37 completed weeks of gestation, unspecified trimester: Secondary | ICD-10-CM | POA: Insufficient documentation

## 2011-11-23 MED ORDER — BETAMETHASONE SOD PHOS & ACET 6 (3-3) MG/ML IJ SUSP
12.0000 mg | INTRAMUSCULAR | Status: AC
  Administered 2011-11-23: 12 mg via INTRAMUSCULAR
  Filled 2011-11-23: qty 2

## 2011-11-26 ENCOUNTER — Ambulatory Visit (HOSPITAL_COMMUNITY)
Admission: RE | Admit: 2011-11-26 | Discharge: 2011-11-26 | Disposition: A | Discharge status: Home / Self Care | Payer: Medicaid Other | Source: Ambulatory Visit | Attending: Obstetrics & Gynecology | Admitting: Obstetrics & Gynecology

## 2011-11-26 ENCOUNTER — Ambulatory Visit (HOSPITAL_COMMUNITY)
Admission: RE | Admit: 2011-11-26 | Discharge: 2011-11-26 | Disposition: A | Discharge status: Home / Self Care | Payer: Medicaid Other | Source: Ambulatory Visit | Attending: Nurse Practitioner | Admitting: Nurse Practitioner

## 2011-11-26 DIAGNOSIS — O30049 Twin pregnancy, dichorionic/diamniotic, unspecified trimester: Secondary | ICD-10-CM | POA: Insufficient documentation

## 2011-11-26 DIAGNOSIS — O409XX0 Polyhydramnios, unspecified trimester, not applicable or unspecified: Secondary | ICD-10-CM

## 2011-11-26 DIAGNOSIS — IMO0001 Reserved for inherently not codable concepts without codable children: Secondary | ICD-10-CM

## 2011-11-26 DIAGNOSIS — O30009 Twin pregnancy, unspecified number of placenta and unspecified number of amniotic sacs, unspecified trimester: Secondary | ICD-10-CM

## 2011-11-26 DIAGNOSIS — O34219 Maternal care for unspecified type scar from previous cesarean delivery: Secondary | ICD-10-CM | POA: Insufficient documentation

## 2011-11-26 NOTE — Progress Notes (Signed)
Carol Walls  was seen today for an ultrasound appointment.  See full report in [facility].  Alpha Gula, MD  Dichorionic/diamniotic twin pregnancy at 33+2 weeks Twin A: mild polyhydramnios Twin B: normal amniotic fluid volume  NSTs reactive x 2; normal modified BPP x 2  Continue twice weekly NSTs with weekly AFIs   Growth in 2 weeks

## 2011-11-26 NOTE — ED Notes (Signed)
Pt and husband had questions about going to "holiday" tomorrow.  States its about 30 mins away. Wants to know if she can go or not.  Discussed with Dr. Claudean Severance.  Explained to pt that if she is having any contractions she shouldn't go and to make sure if she does go she is resting and not up walking around a lot.  Instructed to pt and her husband to use best judgement about whether or not to go.  Pt and husband verbalized understanding.

## 2011-12-03 ENCOUNTER — Ambulatory Visit (HOSPITAL_COMMUNITY)
Admission: RE | Admit: 2011-12-03 | Discharge: 2011-12-03 | Disposition: A | Discharge status: Home / Self Care | Payer: Medicaid Other | Source: Ambulatory Visit | Attending: Nurse Practitioner | Admitting: Nurse Practitioner

## 2011-12-03 VITALS — BP 111/63 | HR 97 | Wt 187.0 lb

## 2011-12-03 DIAGNOSIS — O30009 Twin pregnancy, unspecified number of placenta and unspecified number of amniotic sacs, unspecified trimester: Secondary | ICD-10-CM | POA: Insufficient documentation

## 2011-12-03 DIAGNOSIS — O409XX0 Polyhydramnios, unspecified trimester, not applicable or unspecified: Secondary | ICD-10-CM | POA: Insufficient documentation

## 2011-12-03 DIAGNOSIS — O34219 Maternal care for unspecified type scar from previous cesarean delivery: Secondary | ICD-10-CM | POA: Insufficient documentation

## 2011-12-03 NOTE — Progress Notes (Signed)
Carol Walls  was seen today for an ultrasound appointment.  See full report in [facility].  Alpha Gula, MD  DC/DA twin gestation with best dates of 35 2/7 weeks. Overall growth is appropriate.  A 23% growth discordance is appreciated with A being the smaller fetus. Normal amniotic fluid volume x 2.  A: cephalic, maternal right, female fetus.  Interval growth appropriate (22nd %tile)  B: cephalic, maternal left.  Mild polyhydramnios noted Fetal growth is appropriate (76th %tile)  Reactive NST x 2  Continue twice weekly NSTs with weekly AFIs   Recommend delivery at 37-38 weeks.

## 2011-12-06 ENCOUNTER — Encounter (HOSPITAL_COMMUNITY): Payer: Self-pay | Admitting: Anesthesiology

## 2011-12-06 ENCOUNTER — Encounter (HOSPITAL_COMMUNITY)
Admission: AD | Disposition: A | Discharge status: Home / Self Care | Payer: Self-pay | Source: Ambulatory Visit | Attending: Obstetrics

## 2011-12-06 ENCOUNTER — Encounter (HOSPITAL_COMMUNITY): Payer: Self-pay | Admitting: *Deleted

## 2011-12-06 ENCOUNTER — Ambulatory Visit (HOSPITAL_COMMUNITY)
Admission: RE | Admit: 2011-12-06 | Discharge: 2011-12-06 | Disposition: A | Discharge status: Home / Self Care | Payer: Medicaid Other | Source: Ambulatory Visit | Attending: Obstetrics & Gynecology | Admitting: Obstetrics & Gynecology

## 2011-12-06 ENCOUNTER — Inpatient Hospital Stay (HOSPITAL_COMMUNITY)
Admission: AD | Admit: 2011-12-06 | Discharge: 2011-12-09 | DRG: 765 | Disposition: A | Discharge status: Home / Self Care | Payer: Medicaid Other | Source: Ambulatory Visit | Attending: Obstetrics | Admitting: Obstetrics

## 2011-12-06 ENCOUNTER — Observation Stay (HOSPITAL_COMMUNITY): Payer: Medicaid Other | Admitting: Anesthesiology

## 2011-12-06 ENCOUNTER — Other Ambulatory Visit: Payer: Self-pay | Admitting: Obstetrics & Gynecology

## 2011-12-06 DIAGNOSIS — O288 Other abnormal findings on antenatal screening of mother: Secondary | ICD-10-CM

## 2011-12-06 DIAGNOSIS — Z98891 History of uterine scar from previous surgery: Secondary | ICD-10-CM

## 2011-12-06 DIAGNOSIS — O4700 False labor before 37 completed weeks of gestation, unspecified trimester: Secondary | ICD-10-CM

## 2011-12-06 DIAGNOSIS — O30009 Twin pregnancy, unspecified number of placenta and unspecified number of amniotic sacs, unspecified trimester: Secondary | ICD-10-CM | POA: Insufficient documentation

## 2011-12-06 DIAGNOSIS — IMO0001 Reserved for inherently not codable concepts without codable children: Secondary | ICD-10-CM

## 2011-12-06 DIAGNOSIS — O9903 Anemia complicating the puerperium: Secondary | ICD-10-CM | POA: Diagnosis not present

## 2011-12-06 DIAGNOSIS — O34219 Maternal care for unspecified type scar from previous cesarean delivery: Principal | ICD-10-CM | POA: Diagnosis present

## 2011-12-06 DIAGNOSIS — D649 Anemia, unspecified: Secondary | ICD-10-CM | POA: Diagnosis not present

## 2011-12-06 DIAGNOSIS — O4290 Premature rupture of membranes, unspecified as to length of time between rupture and onset of labor, unspecified weeks of gestation: Secondary | ICD-10-CM | POA: Diagnosis present

## 2011-12-06 LAB — TYPE AND SCREEN
ABO/RH(D): B POS
Antibody Screen: NEGATIVE
Unit division: 0
Unit division: 0

## 2011-12-06 LAB — CBC
HCT: 29.4 % — ABNORMAL LOW (ref 36.0–46.0)
Hemoglobin: 10.2 g/dL — ABNORMAL LOW (ref 12.0–15.0)
MCV: 83.1 fL (ref 78.0–100.0)
RBC: 3.54 MIL/uL — ABNORMAL LOW (ref 3.87–5.11)
WBC: 5.8 10*3/uL (ref 4.0–10.5)

## 2011-12-06 SURGERY — Surgical Case
Anesthesia: Regional | Wound class: Clean Contaminated

## 2011-12-06 MED ORDER — KETOROLAC TROMETHAMINE 30 MG/ML IJ SOLN
INTRAMUSCULAR | Status: AC
  Filled 2011-12-06: qty 1

## 2011-12-06 MED ORDER — LACTATED RINGERS IV SOLN: INTRAVENOUS | Status: DC

## 2011-12-06 MED ORDER — DIPHENHYDRAMINE HCL 50 MG/ML IJ SOLN
INTRAMUSCULAR | Status: AC
  Filled 2011-12-06: qty 1

## 2011-12-06 MED ORDER — DIPHENHYDRAMINE HCL 50 MG/ML IJ SOLN
12.5000 mg | INTRAMUSCULAR | Status: DC | PRN
  Administered 2011-12-06 – 2011-12-07 (×3): 12.5 mg via INTRAVENOUS
  Filled 2011-12-06 (×2): qty 1

## 2011-12-06 MED ORDER — MORPHINE SULFATE (PF) 0.5 MG/ML IJ SOLN
INTRAMUSCULAR | Status: DC | PRN
  Administered 2011-12-06: .1 mg via INTRATHECAL

## 2011-12-06 MED ORDER — BUPIVACAINE IN DEXTROSE 0.75-8.25 % IT SOLN
INTRATHECAL | Status: DC | PRN
  Administered 2011-12-06: 1.6 mL via INTRATHECAL

## 2011-12-06 MED ORDER — FENTANYL CITRATE 0.05 MG/ML IJ SOLN
INTRAMUSCULAR | Status: DC | PRN
  Administered 2011-12-06: 25 ug via INTRATHECAL

## 2011-12-06 MED ORDER — SCOPOLAMINE 1 MG/3DAYS TD PT72
MEDICATED_PATCH | TRANSDERMAL | Status: AC
  Filled 2011-12-06: qty 1

## 2011-12-06 MED ORDER — CALCIUM CARBONATE ANTACID 500 MG PO CHEW: 2.0000 | CHEWABLE_TABLET | ORAL | Status: DC | PRN

## 2011-12-06 MED ORDER — ONDANSETRON HCL 4 MG/2ML IJ SOLN
INTRAMUSCULAR | Status: AC
  Filled 2011-12-06: qty 2

## 2011-12-06 MED ORDER — MEPERIDINE HCL 25 MG/ML IJ SOLN: 6.2500 mg | INTRAMUSCULAR | Status: DC | PRN

## 2011-12-06 MED ORDER — CITRIC ACID-SODIUM CITRATE 334-500 MG/5ML PO SOLN
30.0000 mL | Freq: Once | ORAL | Status: AC
  Administered 2011-12-06: 30 mL via ORAL
  Filled 2011-12-06: qty 15

## 2011-12-06 MED ORDER — FENTANYL CITRATE 0.05 MG/ML IJ SOLN
INTRAMUSCULAR | Status: AC
  Filled 2011-12-06: qty 2

## 2011-12-06 MED ORDER — MORPHINE SULFATE 0.5 MG/ML IJ SOLN
INTRAMUSCULAR | Status: AC
  Filled 2011-12-06: qty 10

## 2011-12-06 MED ORDER — ONDANSETRON HCL 4 MG/2ML IJ SOLN
INTRAMUSCULAR | Status: DC | PRN
  Administered 2011-12-06: 4 mg via INTRAVENOUS

## 2011-12-06 MED ORDER — MEPERIDINE HCL 25 MG/ML IJ SOLN
INTRAMUSCULAR | Status: AC
  Filled 2011-12-06: qty 1

## 2011-12-06 MED ORDER — PROMETHAZINE HCL 25 MG/ML IJ SOLN: 6.2500 mg | INTRAMUSCULAR | Status: DC | PRN

## 2011-12-06 MED ORDER — MIDAZOLAM HCL 2 MG/2ML IJ SOLN: 0.5000 mg | Freq: Once | INTRAMUSCULAR | Status: AC | PRN

## 2011-12-06 MED ORDER — LACTATED RINGERS IV SOLN
INTRAVENOUS | Status: DC | PRN
  Administered 2011-12-06 (×2): via INTRAVENOUS

## 2011-12-06 MED ORDER — KETOROLAC TROMETHAMINE 30 MG/ML IJ SOLN
30.0000 mg | Freq: Four times a day (QID) | INTRAMUSCULAR | Status: AC | PRN
  Administered 2011-12-06 – 2011-12-07 (×2): 30 mg via INTRAVENOUS

## 2011-12-06 MED ORDER — DIPHENHYDRAMINE HCL 25 MG PO CAPS
25.0000 mg | ORAL_CAPSULE | ORAL | Status: DC | PRN
  Filled 2011-12-06: qty 1

## 2011-12-06 MED ORDER — KETOROLAC TROMETHAMINE 30 MG/ML IJ SOLN: 30.0000 mg | Freq: Four times a day (QID) | INTRAMUSCULAR | Status: AC | PRN

## 2011-12-06 MED ORDER — DIPHENHYDRAMINE HCL 50 MG/ML IJ SOLN
INTRAMUSCULAR | Status: DC | PRN
  Administered 2011-12-06: 25 mg via INTRAVENOUS

## 2011-12-06 MED ORDER — LACTATED RINGERS IV SOLN
INTRAVENOUS | Status: DC | PRN
  Administered 2011-12-06 (×4): via INTRAVENOUS

## 2011-12-06 MED ORDER — SCOPOLAMINE 1 MG/3DAYS TD PT72
1.0000 | MEDICATED_PATCH | Freq: Once | TRANSDERMAL | Status: DC
  Administered 2011-12-06: 1.5 mg via TRANSDERMAL

## 2011-12-06 MED ORDER — DIPHENHYDRAMINE HCL 50 MG/ML IJ SOLN
INTRAMUSCULAR | Status: AC
  Administered 2011-12-07: 12.5 mg via INTRAVENOUS
  Filled 2011-12-06: qty 1

## 2011-12-06 MED ORDER — MEPERIDINE HCL 25 MG/ML IJ SOLN
6.2500 mg | INTRAMUSCULAR | Status: DC | PRN
  Administered 2011-12-06: 6.25 mg via INTRAVENOUS

## 2011-12-06 MED ORDER — CEFAZOLIN SODIUM-DEXTROSE 2-3 GM-% IV SOLR
2.0000 g | Freq: Once | INTRAVENOUS | Status: AC
  Administered 2011-12-06: 2 g via INTRAVENOUS
  Filled 2011-12-06: qty 50

## 2011-12-06 MED ORDER — DIPHENHYDRAMINE HCL 50 MG/ML IJ SOLN: 25.0000 mg | INTRAMUSCULAR | Status: DC | PRN

## 2011-12-06 MED ORDER — OXYTOCIN 10 UNIT/ML IJ SOLN
40.0000 [IU] | INTRAVENOUS | Status: DC | PRN
  Administered 2011-12-06: 40 [IU] via INTRAVENOUS

## 2011-12-06 MED ORDER — PRENATAL MULTIVITAMIN CH: 1.0000 | ORAL_TABLET | Freq: Every day | ORAL | Status: DC

## 2011-12-06 MED ORDER — ACETAMINOPHEN 325 MG PO TABS: 650.0000 mg | ORAL_TABLET | ORAL | Status: DC | PRN

## 2011-12-06 MED ORDER — OXYTOCIN 10 UNIT/ML IJ SOLN
INTRAMUSCULAR | Status: AC
  Filled 2011-12-06: qty 1

## 2011-12-06 MED ORDER — ZOLPIDEM TARTRATE 5 MG PO TABS: 5.0000 mg | ORAL_TABLET | Freq: Every evening | ORAL | Status: DC | PRN

## 2011-12-06 MED ORDER — PHENYLEPHRINE 40 MCG/ML (10ML) SYRINGE FOR IV PUSH (FOR BLOOD PRESSURE SUPPORT)
PREFILLED_SYRINGE | INTRAVENOUS | Status: AC
  Filled 2011-12-06: qty 5

## 2011-12-06 MED ORDER — EPHEDRINE 5 MG/ML INJ
INTRAVENOUS | Status: AC
  Filled 2011-12-06: qty 10

## 2011-12-06 MED ORDER — DOCUSATE SODIUM 100 MG PO CAPS: 100.0000 mg | ORAL_CAPSULE | Freq: Every day | ORAL | Status: DC

## 2011-12-06 MED ORDER — FENTANYL CITRATE 0.05 MG/ML IJ SOLN
25.0000 ug | INTRAMUSCULAR | Status: DC | PRN
  Administered 2011-12-06: 50 ug via INTRAVENOUS

## 2011-12-06 MED ORDER — FAMOTIDINE IN NACL 20-0.9 MG/50ML-% IV SOLN
20.0000 mg | Freq: Once | INTRAVENOUS | Status: AC
  Administered 2011-12-06 (×2): 20 mg via INTRAVENOUS
  Filled 2011-12-06: qty 50

## 2011-12-06 MED ORDER — FENTANYL CITRATE 0.05 MG/ML IJ SOLN
INTRAMUSCULAR | Status: DC | PRN
  Administered 2011-12-06: 75 ug via INTRAVENOUS

## 2011-12-06 MED ORDER — PHENYLEPHRINE HCL 10 MG/ML IJ SOLN
INTRAMUSCULAR | Status: DC | PRN
  Administered 2011-12-06 (×8): 40 ug via INTRAVENOUS

## 2011-12-06 SURGICAL SUPPLY — 30 items
CLOTH BEACON ORANGE TIMEOUT ST (SAFETY) ×2 IMPLANT
DERMABOND ADVANCED (GAUZE/BANDAGES/DRESSINGS) ×1
DERMABOND ADVANCED .7 DNX12 (GAUZE/BANDAGES/DRESSINGS) ×1 IMPLANT
DRAPE SURG 17X23 STRL (DRAPES) ×2 IMPLANT
DRSG COVADERM 4X10 (GAUZE/BANDAGES/DRESSINGS) ×2 IMPLANT
DURAPREP 26ML APPLICATOR (WOUND CARE) ×2 IMPLANT
ELECT REM PT RETURN 9FT ADLT (ELECTROSURGICAL) ×2
ELECTRODE REM PT RTRN 9FT ADLT (ELECTROSURGICAL) ×1 IMPLANT
EXTRACTOR VACUUM M CUP 4 TUBE (SUCTIONS) IMPLANT
GLOVE BIO SURGEON STRL SZ8.5 (GLOVE) ×4 IMPLANT
GOWN PREVENTION PLUS LG XLONG (DISPOSABLE) ×4 IMPLANT
GOWN PREVENTION PLUS XXLARGE (GOWN DISPOSABLE) ×2 IMPLANT
KIT ABG SYR 3ML LUER SLIP (SYRINGE) IMPLANT
NEEDLE HYPO 25X5/8 SAFETYGLIDE (NEEDLE) ×2 IMPLANT
NS IRRIG 1000ML POUR BTL (IV SOLUTION) ×2 IMPLANT
PACK C SECTION WH (CUSTOM PROCEDURE TRAY) ×2 IMPLANT
PAD OB MATERNITY 4.3X12.25 (PERSONAL CARE ITEMS) IMPLANT
SLEEVE SCD COMPRESS KNEE MED (MISCELLANEOUS) IMPLANT
SUT CHROMIC 0 CT 802H (SUTURE) ×2 IMPLANT
SUT CHROMIC 1 CTX 36 (SUTURE) ×4 IMPLANT
SUT CHROMIC 2 0 SH (SUTURE) ×2 IMPLANT
SUT GUT PLAIN 0 CT-3 TAN 27 (SUTURE) IMPLANT
SUT MON AB 4-0 PS1 27 (SUTURE) ×2 IMPLANT
SUT VIC AB 0 CT1 18XCR BRD8 (SUTURE) IMPLANT
SUT VIC AB 0 CT1 8-18 (SUTURE)
SUT VIC AB 0 CTX 36 (SUTURE) ×2
SUT VIC AB 0 CTX36XBRD ANBCTRL (SUTURE) ×2 IMPLANT
TOWEL OR 17X24 6PK STRL BLUE (TOWEL DISPOSABLE) ×4 IMPLANT
TRAY FOLEY CATH 14FR (SET/KITS/TRAYS/PACK) ×2 IMPLANT
WATER STERILE IRR 1000ML POUR (IV SOLUTION) ×2 IMPLANT

## 2011-12-06 NOTE — Preoperative (Signed)
Beta Blockers   Reason not to administer Beta Blockers:Not Applicable 

## 2011-12-06 NOTE — Anesthesia Postprocedure Evaluation (Signed)
Anesthesia Post Note  Patient: Carol Walls  Procedure(s) Performed: Procedure(s) (LRB): CESAREAN SECTION (N/A)  Anesthesia type:sab   Patient location: PACU  Post pain: Pain level controlled  Post assessment: Post-op Vital signs reviewed  Last Vitals:  Filed Vitals:   12/06/11 2215  BP: 119/73  Pulse: 87  Temp:   Resp: 24    Post vital signs: Reviewed  Level of consciousness: awake  Complications: No apparent anesthesia complications

## 2011-12-06 NOTE — Transfer of Care (Signed)
Immediate Anesthesia Transfer of Care Note  Patient: Carol Walls  Procedure(s) Performed: Procedure(s) (LRB) with comments: CESAREAN SECTION (N/A) - Repeat Cesarean Section with birth of baby A Boy @ 2125, Baby B Girl  @ 2127  Patient Location: PACU  Anesthesia Type: General  Level of Consciousness: awake, alert  and patient cooperative  Airway & Oxygen Therapy: Patient Spontanous Breathing  Post-op Assessment: Report given to PACU RN and Post -op Vital signs reviewed and stable  Post vital signs: Reviewed and stable  Complications: No apparent anesthesia complications

## 2011-12-06 NOTE — Anesthesia Procedure Notes (Signed)
Spinal  Patient location during procedure: OR Start time: 12/06/2011 9:04 PM Staffing Anesthesiologist: Brayton Caves R Performed by: anesthesiologist  Preanesthetic Checklist Completed: patient identified, site marked, surgical consent, pre-op evaluation, timeout performed, IV checked, risks and benefits discussed and monitors and equipment checked Spinal Block Patient position: sitting Prep: DuraPrep Patient monitoring: heart rate, cardiac monitor, continuous pulse ox and blood pressure Approach: midline Location: L3-4 Injection technique: single-shot Needle Needle type: Sprotte  Needle gauge: 24 G Needle length: 9 cm Assessment Sensory level: T4 Additional Notes Patient identified.  Risk benefits discussed including failed block, incomplete pain control, headache, nerve damage, paralysis, blood pressure changes, nausea, vomiting, reactions to medication both toxic or allergic, and postpartum back pain.  Patient expressed understanding and wished to proceed.  All questions were answered.  Sterile technique used throughout procedure.  CSF was clear.  No parasthesia or other complications.  Please see nursing notes for vital signs.

## 2011-12-06 NOTE — Anesthesia Preprocedure Evaluation (Signed)
Anesthesia Evaluation  Patient identified by MRN, date of birth, ID band Patient awake    Reviewed: Allergy & Precautions, H&P , NPO status , Patient's Chart, lab work & pertinent test results  Airway Mallampati: II      Dental No notable dental hx.    Pulmonary neg pulmonary ROS,  breath sounds clear to auscultation  Pulmonary exam normal       Cardiovascular Exercise Tolerance: Good negative cardio ROS  Rhythm:regular Rate:Normal     Neuro/Psych negative neurological ROS  negative psych ROS   GI/Hepatic negative GI ROS, Neg liver ROS,   Endo/Other  negative endocrine ROS  Renal/GU negative Renal ROS  negative genitourinary   Musculoskeletal   Abdominal Normal abdominal exam  (+)   Peds  Hematology negative hematology ROS (+)   Anesthesia Other Findings Prior back pain  Reproductive/Obstetrics (+) Pregnancy                           Anesthesia Physical Anesthesia Plan  ASA: II and Emergent  Anesthesia Plan: Spinal   Post-op Pain Management:    Induction:   Airway Management Planned:   Additional Equipment:   Intra-op Plan:   Post-operative Plan:   Informed Consent: I have reviewed the patients History and Physical, chart, labs and discussed the procedure including the risks, benefits and alternatives for the proposed anesthesia with the patient or authorized representative who has indicated his/her understanding and acceptance.     Plan Discussed with: Anesthesiologist, CRNA and Surgeon  Anesthesia Plan Comments:         Anesthesia Quick Evaluation

## 2011-12-06 NOTE — Progress Notes (Signed)
Patient ID: Carol Walls, female   DOB: March 08, 1980, 31 y.o.   MRN: [id number] Patient's membranes ruptured spontaneously at 7:30 tonight fluid clear she's pregnant with twins and she is now in labor and had a previous C-section and desires repeat C-section she is contracting every 3 minutes on her cervix is 3 cm 90% twin A at a -2 to -3 station she is 35 weeks and 5 days pregnant her EDC is 01/05/2012

## 2011-12-06 NOTE — Op Note (Signed)
preop diagnosis a gestation at 35 weeks and 5 days with ruptured membranes in active labor previous cesarean section patient desires repeat Postop diagnosis the same Anesthesia spinal Surgeon Dr. Francoise Ceo Procedure patient placed on the operating table in the supine position abdomen prepped and draped bladder emptied with a Foley catheter a transverse suprapubic incision made through the old scar carried down to the rectus fascia fascia cleaned and incised the length of the incision rectus muscles retracted laterally peritoneum incised longitudinally a transverse incision made on the visceroperitoneum above the bladder and the bladder mobilized inferiorly transverse low uterine incision made twin A was vertex female Apgar 8 and 9 twin B membranes ruptured fluid clear vertex delivered from the LOA position Apgar 8 and 9 twin B was a female to placentas anteriorly removed manually and sent to pathology the uterine cavity clean with dry laps the uterine incision closed in one layer with continuous looped abnormal one chromic hemostasis was satisfactory bladder flap reattached with 2-0 chromic lap and sponge counts correct abdomen chosen as peritoneum continuous with 2-0 chromic fascia continuous with of 0 Dexon and the skin closed with staples blood loss 700 cc

## 2011-12-07 ENCOUNTER — Encounter (HOSPITAL_COMMUNITY): Payer: Self-pay | Admitting: *Deleted

## 2011-12-07 LAB — CBC
HCT: 25.1 % — ABNORMAL LOW (ref 36.0–46.0)
Hemoglobin: 8.5 g/dL — ABNORMAL LOW (ref 12.0–15.0)
MCH: 28.1 pg (ref 26.0–34.0)
MCHC: 33.9 g/dL (ref 30.0–36.0)

## 2011-12-07 MED ORDER — SIMETHICONE 80 MG PO CHEW
80.0000 mg | CHEWABLE_TABLET | Freq: Three times a day (TID) | ORAL | Status: DC
  Administered 2011-12-07 – 2011-12-09 (×8): 80 mg via ORAL

## 2011-12-07 MED ORDER — DIPHENHYDRAMINE HCL 25 MG PO CAPS: 25.0000 mg | ORAL_CAPSULE | Freq: Four times a day (QID) | ORAL | Status: DC | PRN

## 2011-12-07 MED ORDER — NALBUPHINE HCL 10 MG/ML IJ SOLN
5.0000 mg | INTRAMUSCULAR | Status: DC | PRN
  Filled 2011-12-07: qty 1

## 2011-12-07 MED ORDER — NALOXONE HCL 0.4 MG/ML IJ SOLN: 0.4000 mg | INTRAMUSCULAR | Status: DC | PRN

## 2011-12-07 MED ORDER — MENTHOL 3 MG MT LOZG: 1.0000 | LOZENGE | OROMUCOSAL | Status: DC | PRN

## 2011-12-07 MED ORDER — LANOLIN HYDROUS EX OINT: 1.0000 "application " | TOPICAL_OINTMENT | CUTANEOUS | Status: DC | PRN

## 2011-12-07 MED ORDER — ACETAMINOPHEN 10 MG/ML IV SOLN
1000.0000 mg | Freq: Four times a day (QID) | INTRAVENOUS | Status: AC | PRN
  Filled 2011-12-07: qty 100

## 2011-12-07 MED ORDER — SODIUM CHLORIDE 0.9 % IV SOLN
1.0000 ug/kg/h | INTRAVENOUS | Status: DC | PRN
  Filled 2011-12-07: qty 2.5

## 2011-12-07 MED ORDER — LACTATED RINGERS IV SOLN
INTRAVENOUS | Status: DC
  Administered 2011-12-07: 02:00:00 via INTRAVENOUS

## 2011-12-07 MED ORDER — SENNOSIDES-DOCUSATE SODIUM 8.6-50 MG PO TABS
2.0000 | ORAL_TABLET | Freq: Every day | ORAL | Status: DC
  Administered 2011-12-07 – 2011-12-08 (×2): 2 via ORAL

## 2011-12-07 MED ORDER — OXYCODONE-ACETAMINOPHEN 5-325 MG PO TABS
1.0000 | ORAL_TABLET | ORAL | Status: DC | PRN
  Administered 2011-12-07: 1 via ORAL
  Administered 2011-12-08: 2 via ORAL
  Administered 2011-12-08 – 2011-12-09 (×4): 1 via ORAL
  Filled 2011-12-07: qty 1
  Filled 2011-12-07: qty 2
  Filled 2011-12-07 (×5): qty 1

## 2011-12-07 MED ORDER — ZOLPIDEM TARTRATE 5 MG PO TABS: 5.0000 mg | ORAL_TABLET | Freq: Every evening | ORAL | Status: DC | PRN

## 2011-12-07 MED ORDER — SODIUM CHLORIDE 0.9 % IJ SOLN: 3.0000 mL | INTRAMUSCULAR | Status: DC | PRN

## 2011-12-07 MED ORDER — TETANUS-DIPHTH-ACELL PERTUSSIS 5-2.5-18.5 LF-MCG/0.5 IM SUSP: 0.5000 mL | Freq: Once | INTRAMUSCULAR | Status: DC

## 2011-12-07 MED ORDER — SIMETHICONE 80 MG PO CHEW: 80.0000 mg | CHEWABLE_TABLET | ORAL | Status: DC | PRN

## 2011-12-07 MED ORDER — IBUPROFEN 600 MG PO TABS
600.0000 mg | ORAL_TABLET | Freq: Four times a day (QID) | ORAL | Status: DC
  Administered 2011-12-07 – 2011-12-09 (×9): 600 mg via ORAL
  Filled 2011-12-07 (×9): qty 1

## 2011-12-07 MED ORDER — DIBUCAINE 1 % RE OINT: 1.0000 "application " | TOPICAL_OINTMENT | RECTAL | Status: DC | PRN

## 2011-12-07 MED ORDER — METOCLOPRAMIDE HCL 5 MG/ML IJ SOLN: 10.0000 mg | Freq: Three times a day (TID) | INTRAMUSCULAR | Status: DC | PRN

## 2011-12-07 MED ORDER — ONDANSETRON HCL 4 MG/2ML IJ SOLN: 4.0000 mg | Freq: Three times a day (TID) | INTRAMUSCULAR | Status: DC | PRN

## 2011-12-07 MED ORDER — PRENATAL MULTIVITAMIN CH
1.0000 | ORAL_TABLET | Freq: Every day | ORAL | Status: DC
  Administered 2011-12-07 – 2011-12-09 (×3): 1 via ORAL
  Filled 2011-12-07 (×3): qty 1

## 2011-12-07 MED ORDER — OXYTOCIN 40 UNITS IN LACTATED RINGERS INFUSION - SIMPLE MED: 62.5000 mL/h | INTRAVENOUS | Status: AC

## 2011-12-07 MED ORDER — WITCH HAZEL-GLYCERIN EX PADS: 1.0000 "application " | MEDICATED_PAD | CUTANEOUS | Status: DC | PRN

## 2011-12-07 MED ORDER — ONDANSETRON HCL 4 MG PO TABS: 4.0000 mg | ORAL_TABLET | ORAL | Status: DC | PRN

## 2011-12-07 MED ORDER — ONDANSETRON HCL 4 MG/2ML IJ SOLN: 4.0000 mg | INTRAMUSCULAR | Status: DC | PRN

## 2011-12-07 NOTE — Anesthesia Postprocedure Evaluation (Signed)
Anesthesia Post Note  Patient: Carol Walls  Procedure(s) Performed: Procedure(s) (LRB): CESAREAN SECTION (N/A)  Anesthesia type: SAB  Patient location: Mother/Baby  Post pain: Pain level controlled  Post assessment: Post-op Vital signs reviewed  Last Vitals:  Filed Vitals:   12/07/11 0608  BP: 106/66  Pulse: 98  Temp: 37.4 C  Resp: 18    Post vital signs: Reviewed  Level of consciousness: awake  Complications: No apparent anesthesia complications

## 2011-12-07 NOTE — Progress Notes (Signed)
Subjective: Postpartum Day 1: Cesarean Delivery Patient reports no complaints.  Objective: Vital signs in last 24 hours: Temp:  [97.8 F (36.6 C)-99.3 F (37.4 C)] 97.9 F (36.6 C) (10/25 1717) Pulse Rate:  [79-110] 79  (10/25 1717) Resp:  [12-24] 18  (10/25 1717) BP: (93-130)/(61-89) 114/68 mmHg (10/25 1717) SpO2:  [95 %-100 %] 100 % (10/25 1717) Weight:  [84.823 kg (187 lb)] 84.823 kg (187 lb) (10/24 1856)  Physical Exam:  General: alert and no distress Lochia: appropriate Uterine Fundus: firm Incision: healing well DVT Evaluation: No evidence of DVT seen on physical exam.   Basename 12/07/11 0535 12/06/11 1820  HGB 8.5* 10.2*  HCT 25.1* 29.4*    Assessment/Plan: Status post Cesarean section. Doing well postoperatively.  Continue current care.  HARPER,CHARLES A 12/07/2011, 6:27 PM

## 2011-12-07 NOTE — Progress Notes (Signed)
UR chart review completed.  

## 2011-12-07 NOTE — Addendum Note (Signed)
Addendum  created 12/07/11 0802 by Jhonnie Garner, CRNA   Modules edited:Notes Section

## 2011-12-08 ENCOUNTER — Encounter (HOSPITAL_COMMUNITY): Payer: Self-pay | Admitting: *Deleted

## 2011-12-08 LAB — CBC
Platelets: 100 10*3/uL — ABNORMAL LOW (ref 150–400)
RDW: 13.8 % (ref 11.5–15.5)
WBC: 6.4 10*3/uL (ref 4.0–10.5)

## 2011-12-08 MED ORDER — FUSION PLUS PO CAPS: 1.0000 | ORAL_CAPSULE | Freq: Every day | ORAL | Status: DC

## 2011-12-08 MED ORDER — PRENATAL MULTIVITAMIN CH: 1.0000 | ORAL_TABLET | Freq: Every day | ORAL | Status: DC

## 2011-12-08 MED ORDER — IBUPROFEN 600 MG PO TABS: 600.0000 mg | ORAL_TABLET | Freq: Four times a day (QID) | ORAL | Status: DC

## 2011-12-08 MED ORDER — OXYCODONE-ACETAMINOPHEN 5-325 MG PO TABS: 1.0000 | ORAL_TABLET | ORAL | Status: DC | PRN

## 2011-12-08 NOTE — Discharge Summary (Signed)
Obstetric Discharge Summary Reason for Admission:  Rupture of membranes.  Term.  Previous C/S.  Desired repeat C/S. Prenatal Procedures: ultrasound Intrapartum Procedures: cesarean: low cervical, transverse Postpartum Procedures: none Complications-Operative and Postpartum: none Hemoglobin  Date Value Range Status  12/08/2011 7.8* 12.0 - 15.0 g/dL Final     HCT  Date Value Range Status  12/08/2011 23.6* 36.0 - 46.0 % Final    Physical Exam:  General: alert and no distress Lochia: appropriate Uterine Fundus: firm Incision: healing well DVT Evaluation: No evidence of DVT seen on physical exam.  Discharge Diagnoses: Term pregnancy. SROM.  Previous C/S.  Elective repeat C/S.  Discharge Information: Date: 12/08/2011 Activity: pelvic rest Diet: routine Medications: PNV, Ibuprofen, Colace, Iron and Percocet Condition: stable Instructions: refer to practice specific booklet Discharge to: home Follow-up Information    Follow up with Antionette Char A, MD. Schedule an appointment as soon as possible for a visit in 2 weeks.   Contact information:   [address] [place] [address] [phone]          Newborn Data:   Kirbi, Sirmon [[id number]]  Live born female  Birth Weight: 4 lb 11.5 oz (2140 g) APGAR: 8, [address]   Kissey, Chou [[id number]]  Live born female  Birth Weight: 5 lb 5.7 oz (2430 g) APGAR: 8, 9  Home with mother.  Keyler Hoge A 12/08/2011, 7:39 AM

## 2011-12-08 NOTE — Progress Notes (Signed)
Subjective: Postpartum Day 3: Cesarean Delivery Patient reports incisional pain, tolerating PO, + flatus and no problems voiding.    Objective: Vital signs in last 24 hours: Temp:  [97.9 F (36.6 C)-98.7 F (37.1 C)] 98.2 F (36.8 C) (10/26 0548) Pulse Rate:  [77-110] 88  (10/26 0548) Resp:  [18] 18  (10/26 0548) BP: (106-114)/(68-75) 106/70 mmHg (10/26 0548) SpO2:  [98 %-100 %] 99 % (10/26 0548)  Physical Exam:  General: alert and no distress Lochia: appropriate Uterine Fundus: firm Incision: healing well DVT Evaluation: No evidence of DVT seen on physical exam.   Basename 12/08/11 0500 12/07/11 0535  HGB 7.8* 8.5*  HCT 23.6* 25.1*    Assessment/Plan: Status post Cesarean section. Doing well postoperatively.   Anemia.  Chronic.  Clinically stable.  Iron Rx. Discharge home with standard precautions and return to clinic in 2 weeks.  HARPER,CHARLES A 12/08/2011, 7:31 AM

## 2011-12-08 NOTE — Progress Notes (Signed)
Subjective: Postpartum Day 2: Cesarean Delivery Patient reports incisional pain, tolerating PO, + flatus and no problems voiding.    Objective: Vital signs in last 24 hours: Temp:  [97.9 F (36.6 C)-98.7 F (37.1 C)] 98.2 F (36.8 C) (10/26 0548) Pulse Rate:  [77-110] 88  (10/26 0548) Resp:  [18] 18  (10/26 0548) BP: (106-114)/(68-75) 106/70 mmHg (10/26 0548) SpO2:  [98 %-100 %] 99 % (10/26 0548)  Physical Exam:  General: alert and no distress Lochia: appropriate Uterine Fundus: firm Incision: healing well DVT Evaluation: No evidence of DVT seen on physical exam.   Basename 12/08/11 0500 12/07/11 0535  HGB 7.8* 8.5*  HCT 23.6* 25.1*    Assessment/Plan: Status post Cesarean section. Doing well postoperatively.  Continue current care.  HARPER,CHARLES A 12/08/2011, 7:26 AM

## 2011-12-09 LAB — CBC
HCT: 23.8 % — ABNORMAL LOW (ref 36.0–46.0)
Hemoglobin: 8 g/dL — ABNORMAL LOW (ref 12.0–15.0)
WBC: 5.4 10*3/uL (ref 4.0–10.5)

## 2011-12-09 NOTE — Progress Notes (Signed)
Subjective: Postpartum Day 3: Cesarean Delivery Patient reports tolerating PO, + flatus, + BM and no problems voiding.    Objective: Vital signs in last 24 hours: Temp:  [98.3 F (36.8 C)-98.4 F (36.9 C)] 98.4 F (36.9 C) (10/26 2140) Pulse Rate:  [101-105] 105  (10/26 2140) Resp:  [18] 18  (10/26 2140) BP: (100-103)/(68-73) 100/68 mmHg (10/26 2140)  Physical Exam:  General: alert and no distress Lochia: appropriate Uterine Fundus: firm Incision: healing well DVT Evaluation: No evidence of DVT seen on physical exam.   Basename 12/08/11 0500 12/07/11 0535  HGB 7.8* 8.5*  HCT 23.6* 25.1*    Assessment/Plan: Status post Cesarean section. Doing well postoperatively.  Anemia.  Clinically stable.  Iron Rx. Discharge home with standard precautions and return to clinic in 2 weeks.  Aishani Kalis A 12/09/2011, 5:58 AM

## 2011-12-10 ENCOUNTER — Ambulatory Visit (HOSPITAL_COMMUNITY): Payer: Medicaid Other

## 2011-12-10 ENCOUNTER — Encounter (HOSPITAL_COMMUNITY): Payer: Self-pay | Admitting: Obstetrics

## 2012-02-17 ENCOUNTER — Encounter (HOSPITAL_COMMUNITY): Payer: Self-pay | Admitting: Family

## 2012-02-17 ENCOUNTER — Inpatient Hospital Stay (HOSPITAL_COMMUNITY)
Admission: AD | Admit: 2012-02-17 | Discharge: 2012-02-17 | Disposition: A | Discharge status: Home / Self Care | Payer: Medicaid Other | Source: Ambulatory Visit | Attending: Obstetrics & Gynecology | Admitting: Obstetrics & Gynecology

## 2012-02-17 DIAGNOSIS — O9229 Other disorders of breast associated with pregnancy and the puerperium: Secondary | ICD-10-CM | POA: Insufficient documentation

## 2012-02-17 DIAGNOSIS — N6459 Other signs and symptoms in breast: Secondary | ICD-10-CM

## 2012-02-17 DIAGNOSIS — N644 Mastodynia: Secondary | ICD-10-CM

## 2012-02-17 NOTE — MAU Provider Note (Signed)
History     CSN: [id number]  Arrival date and time: 02/17/12 1120   First Provider Initiated Contact with Patient 02/17/12 1219      Chief Complaint  Patient presents with  . Breast Pain   HPI Carol Walls 32 y.o. Comes to MAU with a sore nipple.  History of breastfeeding a 37 month old baby.  Developed a painful bump on her left nipple yesterday at 11 am.  Has continued to be painful and 11 am yesterday was the last time her breast was emptied.  Has not put the baby to breast or pumped on the left side.  Has fed only on the right side.  Still has pain there today but the pain is less.  No fever.  No pain in right breast.  Wants to breastfeed for at least 1 1/2 years.  OB History    Grav Para Term Preterm Abortions TAB SAB Ect Mult Living   6 5 4 1 1  0 1 0 1 6      Past Medical History  Diagnosis Date  . Back pain   . No pertinent past medical history     Past Surgical History  Procedure Date  . Cesarean section   . Cesarean section 12/06/2011    Procedure: CESAREAN SECTION;  Surgeon: Kathreen Cosier, MD;  Location: [facility];  Service: Obstetrics;  Laterality: N/A;  Repeat Cesarean Section with birth of baby A Boy @ 2125, 57 B Girl  @ 2127    History reviewed. No pertinent family history.  History  Substance Use Topics  . Smoking status: Never Smoker   . Smokeless tobacco: Never Used  . Alcohol Use: No    Allergies: No Known Allergies  Prescriptions prior to admission  Medication Sig Dispense Refill  . acetaminophen (TYLENOL) 500 MG tablet Take 500 mg by mouth every 6 (six) hours as needed. For pain      . Iron-FA-B Cmp-C-Biot-Probiotic (FUSION PLUS) CAPS Take 1 capsule by mouth daily before breakfast.  30 capsule  5  . Prenatal Vit-Fe Fumarate-FA (PRENATAL MULTIVITAMIN) TABS Take 1 tablet by mouth daily.  30 tablet  11    Review of Systems  Constitutional: Negative for fever, chills and malaise/fatigue.  Gastrointestinal: Negative for nausea and vomiting.    Genitourinary:       Pain in left breast  Musculoskeletal: Negative for myalgias.   Physical Exam   Blood pressure 111/70, pulse 79, temperature 97.7 F (36.5 C), temperature source Oral, resp. rate 16, height 5\' 6"  (1.676 m), weight 77.293 kg (170 lb 6.4 oz), currently breastfeeding.  Physical Exam  Nursing note and vitals reviewed. Constitutional: She is oriented to person, place, and time. She appears well-developed and well-nourished.  HENT:  Head: Normocephalic.  Eyes: EOM are normal.  Neck: Neck supple.  Genitourinary:       Breast exam: Both breasts soft No cracks or redness on nipples. On the side of the left nipple there is a 3 mm swollen tender area.  No drainage.  Tenderness in the areola near the swollen lesion.  No drainage seen.  Has a painful area deep in breast at 4 oclock.  No redness, no hard areas, no red streaking.  Musculoskeletal: Normal range of motion.  Neurological: She is alert and oriented to person, place, and time.  Skin: Skin is warm and dry.  Psychiatric: She has a normal mood and affect.    MAU Course  Procedures  MDM Lactation consultant in to  see client to evaluated breast.  Discussed findings with consultant.  Irreg shaped nipple on left and even though this lesion is on the side of the nipple, it is very possible a milk duct that is blocked.  Lactation consultant reviewed plan with client.  We are both in agreement that this is not mastitis at this time and that client will need to feed on this breast and keep it emptied.  Follow up with MD if condition worsens.  Assessment and Plan  Sore nipple on the left  Plan Warm compresses to nipple and feed or pump the breast regularly  - every time when the baby is feeding on the right side. See your doctor if the problem gets worse.  Carol Walls 02/17/2012, 12:39 PM

## 2012-02-17 NOTE — MAU Note (Addendum)
Patient presents to MAU with c/o L breast pain x 3 days; patient has been breastfeeding since c-section delivery 10/24. Last time she expressed from L breast was yesterday at 1100. States that yesterday anything touching the breast was very painful. Yesterday she noted a small raised red area on L nipple and today that area is very sore. Denies drainage from the red area on nipple.

## 2012-02-17 NOTE — MAU Note (Signed)
Pt delivered in October, breastfeeding, has pain in L breast, appears to have blister on nipple.

## 2012-02-17 NOTE — Consult Note (Signed)
Called to MAU.  This is a Mom with a 35 1/2 month old breast feeding baby.  2-3 days ago she started feeling pain on her left nipple causing her to stop breast feeding her baby.  She said she hasn't fed or pumped (manual pump) in 24 hrs.  Breast is soft, nipple noted to have a small, raised, pink area adjacent to the nipple about 1 o'clock.  This spot is tender to the touch.  Manual breast expression done, and milk expressed easily.  Nipple on the left side is irregular in shape, and possible plugged duct developing.  Breast tender up from this area on the nipple.  No redness on the breast.  No fever, headache, or flu like symptoms.  Recommended warm, moist compresses to that breast, massage towards the nipple.  When she feeds her baby, to line the baby's nose towards the tender area as this will help her drain that area. If no improvement in 24-48 hrs, to call her doctor.  Mom understands and returned the plan to RN and LC.

## 2012-10-23 ENCOUNTER — Encounter (HOSPITAL_COMMUNITY): Payer: Self-pay | Admitting: Emergency Medicine

## 2012-10-23 ENCOUNTER — Emergency Department (INDEPENDENT_AMBULATORY_CARE_PROVIDER_SITE_OTHER)
Admission: EM | Admit: 2012-10-23 | Discharge: 2012-10-23 | Disposition: A | Discharge status: Home / Self Care | Payer: Self-pay | Source: Home / Self Care | Attending: Family Medicine | Admitting: Family Medicine

## 2012-10-23 DIAGNOSIS — J029 Acute pharyngitis, unspecified: Secondary | ICD-10-CM

## 2012-10-23 DIAGNOSIS — IMO0002 Reserved for concepts with insufficient information to code with codable children: Secondary | ICD-10-CM

## 2012-10-23 DIAGNOSIS — L03011 Cellulitis of right finger: Secondary | ICD-10-CM

## 2012-10-23 MED ORDER — ACETAMINOPHEN 325 MG PO TABS
ORAL_TABLET | ORAL | Status: AC
  Filled 2012-10-23: qty 2

## 2012-10-23 MED ORDER — FLUTICASONE PROPIONATE 50 MCG/ACT NA SUSP: 2.0000 | Freq: Every day | NASAL | Status: DC

## 2012-10-23 MED ORDER — CEPHALEXIN 500 MG PO CAPS: 500.0000 mg | ORAL_CAPSULE | Freq: Four times a day (QID) | ORAL | Status: DC

## 2012-10-23 MED ORDER — ACETAMINOPHEN 325 MG PO TABS
650.0000 mg | ORAL_TABLET | Freq: Once | ORAL | Status: AC
  Administered 2012-10-23: 650 mg via ORAL

## 2012-10-23 NOTE — ED Provider Notes (Signed)
Carol Walls is a 32 y.o. female who presents to Urgent Care today for  1) right thumb infection. Patient has noted worsening pain and swelling at the right thumb. She developed an abscess just proximal to the ulnar side nail fold. It is quite painful. She has tried some over-the-counter pain medications which have not helped. She denies any vomiting diarrhea fevers or chills. She feels well otherwise.  2) additionally patient notes headache bilateral ear pain and sore throat over the last 2 weeks. She denies any fevers or chills nausea vomiting diarrhea. She's tried some over-the-counter pain medications have not helped. She denies any cough congestion or trouble breathing.   Past Medical History  Diagnosis Date  . Back pain   . No pertinent past medical history    History  Substance Use Topics  . Smoking status: Never Smoker   . Smokeless tobacco: Never Used  . Alcohol Use: No   ROS as above Medications reviewed. No current facility-administered medications for this encounter.   Current Outpatient Prescriptions  Medication Sig Dispense Refill  . acetaminophen (TYLENOL) 500 MG tablet Take 500 mg by mouth every 6 (six) hours as needed. For pain      . cephALEXin (KEFLEX) 500 MG capsule Take 1 capsule (500 mg total) by mouth 4 (four) times daily.  40 capsule  0  . fluticasone (FLONASE) 50 MCG/ACT nasal spray Place 2 sprays into the nose daily.  16 g  2  . Iron-FA-B Cmp-C-Biot-Probiotic (FUSION PLUS) CAPS Take 1 capsule by mouth daily before breakfast.  30 capsule  5  . Prenatal Vit-Fe Fumarate-FA (PRENATAL MULTIVITAMIN) TABS Take 1 tablet by mouth daily.  30 tablet  11    Exam:  BP 121/86  Pulse 86  Temp(Src) 99.4 F (37.4 C) (Oral)  Resp 18  SpO2 96%  Breastfeeding? Yes Gen: Well NAD HEENT: EOMI,  MMM, right tympanic membrane occluded by cerumen. Left is normal appearing. Posterior pharynx is erythematous. Lungs: CTABL Nl WOB Heart: RRR no MRG Abd: NABS, NT, ND Exts: Non  edematous BL  LE, warm and well perfused.  Right thumb: Erythematous with large superficial abscess just proximal to the nail fold on the ulnar side.    Abscess incision and drainage.  Consent obtained timeout performed The skin overlying the abscess was cleaned with alcohol.  Cold spray applied and a small neck was made in the skin and a large amount of pus was expressed.  The pus was cultured.  Patient tolerated the procedure well  Results for orders placed during the hospital encounter of 10/23/12 (from the past 24 hour(s))  POCT RAPID STREP A ([facility] ONLY)     Status: None   Collection Time    10/23/12  9:24 AM      Result Value Range   Streptococcus, Group A Screen (Direct) NEGATIVE  NEGATIVE   No results found.  Assessment and Plan: 32 y.o. female with  1) right thumb paronychia. Drained and cultured today.  Plan to place patient on empiric antibiotics. We will use Keflex as patient is currently breast-feeding. If the culture comes back positive for MRSA we will have to use clindamycin or some other medication.  Followup as needed.   2) sore throat and ear pain. Most likely due to seasonal allergies. Strep test is negative. Will use Flonase nasal spray.  Discussed warning signs or symptoms. Please see discharge instructions. Patient expresses understanding.      Rodolph Bong, MD 10/23/12 [phone]

## 2012-10-23 NOTE — ED Notes (Signed)
Pt c/o right thumb infection onset Monday Also c/o ST, HA onset 2 weeks Sxs also include: fevers She is alert w/no signs of acute distress.

## 2012-10-25 ENCOUNTER — Telehealth (HOSPITAL_COMMUNITY): Payer: Self-pay | Admitting: Family Medicine

## 2012-10-25 LAB — WOUND CULTURE

## 2012-10-25 NOTE — ED Notes (Signed)
Abscess culture R thumb: Mod. MRSA.  Pt. treated with Keflex.  Pt. notified by Dr. Denyse Amass

## 2012-10-25 NOTE — ED Notes (Signed)
Patient called back. She shows considerable improvement in symptoms with drainage of the abscess and antibiotics.  Plan to continue [facility].  Patient is breast feeding and do not want to use doxy, septra or clinda in this case if avoidable.   F/u PRN.   Rodolph Bong, MD 10/25/12 [phone]

## 2012-10-25 NOTE — ED Notes (Signed)
Called patient with test results.  Left a message asking to call me back.   Rodolph Bong, MD 10/25/12 [phone]

## 2012-10-26 ENCOUNTER — Telehealth (HOSPITAL_COMMUNITY): Payer: Self-pay | Admitting: *Deleted

## 2012-10-26 NOTE — ED Notes (Signed)
I called pt.  Pt. verified x 2 and given results.  She put her husband on the phone.  I reviewed the [facility] MRSA instructions with him.  He voiced understanding. Carol Walls 10/26/2012

## 2013-03-13 ENCOUNTER — Ambulatory Visit (INDEPENDENT_AMBULATORY_CARE_PROVIDER_SITE_OTHER): Payer: 59 | Admitting: Obstetrics & Gynecology

## 2013-03-13 DIAGNOSIS — Z3046 Encounter for surveillance of implantable subdermal contraceptive: Secondary | ICD-10-CM

## 2013-03-16 ENCOUNTER — Encounter: Payer: Self-pay | Admitting: Obstetrics & Gynecology

## 2013-03-17 NOTE — Progress Notes (Signed)
[facility] REMOVAL   Reasons  for removal:  Time for removal   A timeout was performed confirming the patient, the procedure and allergy status. The patient's left  arm was palpated and the implant device located. The area was prepped with Betadinex3. The distal end of the device was palpated and 5 cc of 1% lidocaine was injected. A 5 mm incision was made. Any fibrotic tissue was carefully dissected away using blunt and/or sharp dissection. The device was removed in an intact manner. Steri-strips and a sterile dressing were applied to the incision. The patient tolerated the procedure well.

## 2013-04-23 ENCOUNTER — Encounter: Payer: Self-pay | Admitting: Obstetrics & Gynecology

## 2013-04-23 ENCOUNTER — Ambulatory Visit (INDEPENDENT_AMBULATORY_CARE_PROVIDER_SITE_OTHER): Payer: 59 | Admitting: Obstetrics & Gynecology

## 2013-04-23 ENCOUNTER — Institutional Professional Consult (permissible substitution): Payer: Self-pay | Admitting: Obstetrics & Gynecology

## 2013-04-23 VITALS — BP 115/79 | HR 84 | Temp 98.8°F | Ht 66.0 in | Wt 167.0 lb

## 2013-04-23 DIAGNOSIS — Z309 Encounter for contraceptive management, unspecified: Secondary | ICD-10-CM

## 2013-04-23 NOTE — Progress Notes (Signed)
Patient unable to be seen today due to device not being in the office. Patient notified that device would be ordered and that we would call her to reschedule when device came in. Patient given more samples of birth control pills to take until device came in.

## 2013-05-06 ENCOUNTER — Other Ambulatory Visit: Payer: Self-pay | Admitting: *Deleted

## 2013-05-06 ENCOUNTER — Ambulatory Visit (INDEPENDENT_AMBULATORY_CARE_PROVIDER_SITE_OTHER): Payer: 59 | Admitting: Obstetrics & Gynecology

## 2013-05-06 ENCOUNTER — Encounter: Payer: Self-pay | Admitting: Obstetrics & Gynecology

## 2013-05-06 VITALS — BP 134/83 | HR 88 | Wt 166.0 lb

## 2013-05-06 DIAGNOSIS — Z30430 Encounter for insertion of intrauterine contraceptive device: Secondary | ICD-10-CM

## 2013-05-06 DIAGNOSIS — Z3202 Encounter for pregnancy test, result negative: Secondary | ICD-10-CM

## 2013-05-06 LAB — POCT URINE PREGNANCY: PREG TEST UR: NEGATIVE

## 2013-05-06 MED ORDER — PARAGARD INTRAUTERINE COPPER IU IUD
1.0000 | INTRAUTERINE_SYSTEM | Freq: Once | INTRAUTERINE | Status: AC
  Administered 2013-05-06: 1 via INTRAUTERINE

## 2013-05-06 MED ORDER — DOXYCYCLINE HYCLATE 50 MG PO CAPS: 100.0000 mg | ORAL_CAPSULE | Freq: Two times a day (BID) | ORAL | Status: DC

## 2013-05-06 NOTE — Patient Instructions (Signed)
Intrauterine Device Insertion, Care After  Refer to this sheet in the next few weeks. These instructions provide you with information on caring for yourself after your procedure. Your health care provider may also give you more specific instructions. Your treatment has been planned according to current medical practices, but problems sometimes occur. Call your health care provider if you have any problems or questions after your procedure.  WHAT TO EXPECT AFTER THE PROCEDURE  Insertion of the IUD may cause some discomfort, such as cramping. The cramping should improve after the IUD is in place. You may have bleeding after the procedure. This is normal. It varies from light spotting for a few days to menstrual-like bleeding. When the IUD is in place, a string will extend past the cervix into the vagina for 1 2 inches. The strings should not bother you or your partner. If they do, talk to your health care provider.   HOME CARE INSTRUCTIONS    Check your intrauterine device (IUD) to make sure it is in place before you resume sexual activity. You should be able to feel the strings. If you cannot feel the strings, something may be wrong. The IUD may have fallen out of the uterus, or the uterus may have been punctured (perforated) during placement. Also, if the strings are getting longer, it may mean that the IUD is being forced out of the uterus. You no longer have full protection from pregnancy if any of these problems occur.   You may resume sexual intercourse if you are not having problems with the IUD. The copper IUD is considered immediately effective, and the hormone IUD works right away if inserted within 7 days of your period starting. You will need to use a backup method of birth control for 7 days if the IUD in inserted at any other time in your cycle.   Continue to check that the IUD is still in place by feeling for the strings after every menstrual period.   You may need to take pain medicine such as  acetaminophen or ibuprofen. Only take medicines as directed by your health care provider.  SEEK MEDICAL CARE IF:    You have bleeding that is heavier or lasts longer than a normal menstrual cycle.   You have a fever.   You have increasing cramps or abdominal pain not relieved with medicine.   You have abdominal pain that does not seem to be related to the same area of earlier cramping and pain.   You are lightheaded, unusually weak, or faint.   You have abnormal vaginal discharge or smells.   You have pain during sexual intercourse.   You cannot feel the IUD strings, or the IUD string has gotten longer.   You feel the IUD at the opening of the cervix in the vagina.   You think you are pregnant, or you miss your menstrual period.   The IUD string is hurting your sex partner.  MAKE SURE YOU:   Understand these instructions.   Will watch your condition.   Will get help right away if you are not doing well or get worse.  Document Released: 09/27/2010 Document Revised: 11/19/2012 Document Reviewed: 07/20/2012  [employer] Patient Information 2014 [employer]

## 2013-05-06 NOTE — Progress Notes (Signed)
IUD Insertion Procedure Note  Pre-operative Diagnosis: Desires IUD for contraception  Post-operative Diagnosis: Desires IUD for contraception Indications: contraception  Procedure Details  Urine pregnancy test was done  and result was negative.  The risks (including infection, bleeding, pain, and uterine perforation) and benefits of the procedure were explained to the patient and Written informed consent was obtained.    Cervix cleansed with Betadine. Uterus sounded to 9 cm. IUD inserted without difficulty. String visible and trimmed. Patient tolerated procedure well.  IUD Information: ParaGard.  Condition: Stable  Complications: None  Plan:  The patient was advised to call for any fever or for prolonged or severe pain or bleeding. She was advised to use OTC analgesics as needed for mild to moderate pain.

## 2013-05-06 NOTE — Addendum Note (Signed)
Addended by: Ladona Ridgel on: 05/06/2013 12:05 PM   Modules accepted: Orders

## 2013-06-17 ENCOUNTER — Ambulatory Visit: Payer: 59 | Admitting: Obstetrics & Gynecology

## 2013-06-18 ENCOUNTER — Ambulatory Visit (INDEPENDENT_AMBULATORY_CARE_PROVIDER_SITE_OTHER): Payer: 59 | Admitting: Obstetrics & Gynecology

## 2013-06-18 ENCOUNTER — Encounter: Payer: Self-pay | Admitting: Obstetrics & Gynecology

## 2013-06-18 VITALS — BP 111/71 | HR 88 | Temp 98.7°F | Ht 66.0 in | Wt 168.0 lb

## 2013-06-18 DIAGNOSIS — Z30431 Encounter for routine checking of intrauterine contraceptive device: Secondary | ICD-10-CM

## 2013-06-18 MED ORDER — PRENATAL MULTIVITAMIN CH: 1.0000 | ORAL_TABLET | Freq: Every day | ORAL | Status: DC

## 2013-06-18 NOTE — Progress Notes (Signed)
Patient ID: Carol Walls, female   DOB: 1980-09-28, 33 y.o.   MRN: [id number]  Chief Complaint  Patient presents with  . 6 Week Recheck.    IUD    HPI Carol Walls is a 33 y.o. female.  No complaints related to IUD.  HPI  Past Medical History  Diagnosis Date  . Back pain   . No pertinent past medical history     Past Surgical History  Procedure Laterality Date  . Cesarean section    . Cesarean section  12/06/2011    Procedure: CESAREAN SECTION;  Surgeon: Frederico Hamman, MD;  Location: [facility];  Service: Obstetrics;  Laterality: N/A;  Repeat Cesarean Section with birth of baby A Boy @ 2125, 82 B Girl  @ 2127    History reviewed. No pertinent family history.  Social History History  Substance Use Topics  . Smoking status: Never Smoker   . Smokeless tobacco: Never Used  . Alcohol Use: No    No Known Allergies  Current Outpatient Prescriptions  Medication Sig Dispense Refill  . acetaminophen (TYLENOL) 500 MG tablet Take 500 mg by mouth every 6 (six) hours as needed. For pain      . Prenatal Vit-Fe Fumarate-FA (PRENATAL MULTIVITAMIN) TABS tablet Take 1 tablet by mouth daily.  30 tablet  11  . [DISCONTINUED] Prenatal Vit-Fe Fumarate-FA (PRENATAL MULTIVITAMIN) TABS Take 1 tablet by mouth daily.  30 tablet  11   No current facility-administered medications for this visit.    Review of Systems Review of Systems Constitutional: negative for fatigue and weight loss Respiratory: negative for cough and wheezing Cardiovascular: negative for chest pain, fatigue and palpitations Gastrointestinal: negative for abdominal pain and change in bowel habits Genitourinary:negative Integument/breast: negative for nipple discharge Musculoskeletal:negative for myalgias; positive for back pain Neurological: negative for gait problems and tremors Behavioral/Psych: negative for abusive relationship, depression Endocrine: negative for temperature intolerance     Blood pressure  111/71, pulse 88, temperature 98.7 F (37.1 C), height 5\' 6"  (1.676 m), weight 76.204 kg (168 lb), last menstrual period 06/16/2013, currently breastfeeding.  Physical Exam Physical Exam General:   alert  Skin:   no rash or abnormalities  Lungs:   clear to auscultation bilaterally  Heart:   regular rate and rhythm, S1, S2 normal, no murmur, click, rub or gallop  Breasts:   normal without suspicious masses, skin or nipple changes or axillary nodes  Abdomen:  normal findings: no organomegaly, soft, non-tender and no hernia  Pelvis:  External genitalia: normal general appearance Urinary system: urethral meatus normal and bladder without fullness, nontender Vaginal: normal without tenderness, induration or masses; small heme Cervix: normal appearance; IUD strings seen Adnexa: normal bimanual exam Uterus: anteverted and non-tender, normal size      Data Reviewed none  Assessment    Normal exam s/p IUD placement Chronic back pain    Plan    Referral-->Sports Medicine Meds ordered this encounter  Medications  . Prenatal Vit-Fe Fumarate-FA (PRENATAL MULTIVITAMIN) TABS tablet    Sig: Take 1 tablet by mouth daily.    Dispense:  30 tablet    Refill:  11   Follow up as needed or in 1 yr         Lahoma Crocker 06/18/2013, 1:32 PM

## 2013-06-18 NOTE — Patient Instructions (Signed)
Back Pain, Adult Low back pain is very common. About 1 in 5 people have back pain.The cause of low back pain is rarely dangerous. The pain often gets better over time.About half of people with a sudden onset of back pain feel better in just 2 weeks. About 8 in 10 people feel better by 6 weeks.  CAUSES Some common causes of back pain include:  Strain of the muscles or ligaments supporting the spine.  Wear and tear (degeneration) of the spinal discs.  Arthritis.  Direct injury to the back. DIAGNOSIS Most of the time, the direct cause of low back pain is not known.However, back pain can be treated effectively even when the exact cause of the pain is unknown.Answering your caregiver's questions about your overall health and symptoms is one of the most accurate ways to make sure the cause of your pain is not dangerous. If your caregiver needs more information, he or she may order lab work or imaging tests (X-rays or MRIs).However, even if imaging tests show changes in your back, this usually does not require surgery. HOME CARE INSTRUCTIONS For many people, back pain returns.Since low back pain is rarely dangerous, it is often a condition that people can learn to manageon their own.   Remain active. It is stressful on the back to sit or stand in one place. Do not sit, drive, or stand in one place for more than 30 minutes at a time. Take short walks on level surfaces as soon as pain allows.Try to increase the length of time you walk each day.  Do not stay in bed.Resting more than 1 or 2 days can delay your recovery.  Do not avoid exercise or work.Your body is made to move.It is not dangerous to be active, even though your back may hurt.Your back will likely heal faster if you return to being active before your pain is gone.  Pay attention to your body when you bend and lift. Many people have less discomfortwhen lifting if they bend their knees, keep the load close to their bodies,and  avoid twisting. Often, the most comfortable positions are those that put less stress on your recovering back.  Find a comfortable position to sleep. Use a firm mattress and lie on your side with your knees slightly bent. If you lie on your back, put a pillow under your knees.  Only take over-the-counter or prescription medicines as directed by your caregiver. Over-the-counter medicines to reduce pain and inflammation are often the most helpful.Your caregiver may prescribe muscle relaxant drugs.These medicines help dull your pain so you can more quickly return to your normal activities and healthy exercise.  Put ice on the injured area.  Put ice in a plastic bag.  Place a towel between your skin and the bag.  Leave the ice on for 15-20 minutes, 03-04 times a day for the first 2 to 3 days. After that, ice and heat may be alternated to reduce pain and spasms.  Ask your caregiver about trying back exercises and gentle massage. This may be of some benefit.  Avoid feeling anxious or stressed.Stress increases muscle tension and can worsen back pain.It is important to recognize when you are anxious or stressed and learn ways to manage it.Exercise is a great option. SEEK MEDICAL CARE IF:  You have pain that is not relieved with rest or medicine.  You have pain that does not improve in 1 week.  You have new symptoms.  You are generally not feeling well. SEEK   IMMEDIATE MEDICAL CARE IF:   You have pain that radiates from your back into your legs.  You develop new bowel or bladder control problems.  You have unusual weakness or numbness in your arms or legs.  You develop nausea or vomiting.  You develop abdominal pain.  You feel faint. Document Released: 01/29/2005 Document Revised: 07/31/2011 Document Reviewed: 06/19/2010 [employer] Patient Information 2014 [employer].  

## 2013-12-14 ENCOUNTER — Encounter: Payer: Self-pay | Admitting: Obstetrics & Gynecology

## 2014-01-11 ENCOUNTER — Ambulatory Visit: Payer: 59 | Admitting: Obstetrics & Gynecology

## 2014-01-20 ENCOUNTER — Ambulatory Visit (INDEPENDENT_AMBULATORY_CARE_PROVIDER_SITE_OTHER): Payer: 59 | Admitting: Obstetrics & Gynecology

## 2014-01-20 ENCOUNTER — Other Ambulatory Visit: Payer: Self-pay | Admitting: Obstetrics & Gynecology

## 2014-01-20 ENCOUNTER — Ambulatory Visit (INDEPENDENT_AMBULATORY_CARE_PROVIDER_SITE_OTHER): Payer: 59

## 2014-01-20 VITALS — BP 115/77 | HR 89 | Temp 98.6°F | Wt 164.0 lb

## 2014-01-20 DIAGNOSIS — R102 Pelvic and perineal pain: Secondary | ICD-10-CM

## 2014-01-20 DIAGNOSIS — Z30431 Encounter for routine checking of intrauterine contraceptive device: Secondary | ICD-10-CM

## 2014-01-20 DIAGNOSIS — K589 Irritable bowel syndrome without diarrhea: Secondary | ICD-10-CM

## 2014-01-20 DIAGNOSIS — R1032 Left lower quadrant pain: Secondary | ICD-10-CM

## 2014-01-20 LAB — POCT URINALYSIS DIPSTICK
BILIRUBIN UA: NEGATIVE
GLUCOSE UA: NEGATIVE
Ketones, UA: NEGATIVE
Leukocytes, UA: NEGATIVE
Nitrite, UA: NEGATIVE
Protein, UA: NEGATIVE
RBC UA: NEGATIVE
UROBILINOGEN UA: NEGATIVE
pH, UA: 5

## 2014-01-20 LAB — POCT URINE PREGNANCY: Preg Test, Ur: NEGATIVE

## 2014-01-20 NOTE — Patient Instructions (Signed)
Pelvic Pain Female pelvic pain can be caused by many different things and start from a variety of places. Pelvic pain refers to pain that is located in the lower half of the abdomen and between your hips. The pain may occur over a short period of time (acute) or may be reoccurring (chronic). The cause of pelvic pain may be related to disorders affecting the female reproductive organs (gynecologic), but it may also be related to the bladder, kidney stones, an intestinal complication, or muscle or skeletal problems. Getting help right away for pelvic pain is important, especially if there has been severe, sharp, or a sudden onset of unusual pain. It is also important to get help right away because some types of pelvic pain can be life threatening.  CAUSES  Below are only some of the causes of pelvic pain. The causes of pelvic pain can be in one of several categories.   Gynecologic.  Pelvic inflammatory disease.  Sexually transmitted infection.  Ovarian cyst or a twisted ovarian ligament (ovarian torsion).  Uterine lining that grows outside the uterus (endometriosis).  Fibroids, cysts, or tumors.  Ovulation.  Pregnancy.  Pregnancy that occurs outside the uterus (ectopic pregnancy).  Miscarriage.  Labor.  Abruption of the placenta or ruptured uterus.  Infection.  Uterine infection (endometritis).  Bladder infection.  Diverticulitis.  Miscarriage related to a uterine infection (septic abortion).  Bladder.  Inflammation of the bladder (cystitis).  Kidney stone(s).  Gastrointestinal.  Constipation.  Diverticulitis.  Neurologic.  Trauma.  Feeling pelvic pain because of mental or emotional causes (psychosomatic).  Cancers of the bowel or pelvis. EVALUATION  Your caregiver will want to take a careful history of your concerns. This includes recent changes in your health, a careful gynecologic history of your periods (menses), and a sexual history. Obtaining your family  history and medical history is also important. Your caregiver may suggest a pelvic exam. A pelvic exam will help identify the location and severity of the pain. It also helps in the evaluation of which organ system may be involved. In order to identify the cause of the pelvic pain and be properly treated, your caregiver may order tests. These tests may include:   A pregnancy test.  Pelvic ultrasonography.  An X-ray exam of the abdomen.  A urinalysis or evaluation of vaginal discharge.  Blood tests. HOME CARE INSTRUCTIONS   Only take over-the-counter or prescription medicines for pain, discomfort, or fever as directed by your caregiver.   Rest as directed by your caregiver.   Eat a balanced diet.   Drink enough fluids to make your urine clear or pale yellow, or as directed.   Avoid sexual intercourse if it causes pain.   Apply warm or cold compresses to the lower abdomen depending on which one helps the pain.   Avoid stressful situations.   Keep a journal of your pelvic pain. Write down when it started, where the pain is located, and if there are things that seem to be associated with the pain, such as food or your menstrual cycle.  Follow up with your caregiver as directed.  SEEK MEDICAL CARE IF:  Your medicine does not help your pain.  You have abnormal vaginal discharge. SEEK IMMEDIATE MEDICAL CARE IF:   You have heavy bleeding from the vagina.   Your pelvic pain increases.   You feel light-headed or faint.   You have chills.   You have pain with urination or blood in your urine.   You have uncontrolled diarrhea   or vomiting.   You have a fever or persistent symptoms for more than 3 days.  You have a fever and your symptoms suddenly get worse.   You are being physically or sexually abused.  MAKE SURE YOU:  Understand these instructions.  Will watch your condition.  Will get help if you are not doing well or get worse. Document Released:  12/27/2003 Document Revised: 06/15/2013 Document Reviewed: 05/21/2011 [facility] Patient Information 2015 [place], [place]. This information is not intended to replace advice given to you by your health care provider. Make sure you discuss any questions you have with your health care provider. Diagnostic Laparoscopy Laparoscopy is a surgical procedure. It is used to diagnose and treat diseases inside the belly (abdomen). It is usually a brief, common, and relatively simple procedure. The laparoscopeis a thin, lighted, pencil-sized instrument. It is like a telescope. It is inserted into your abdomen through a small cut (incision). Your caregiver can look at the organs inside your body through this instrument. He or she can see if there is anything abnormal. Laparoscopy can be done either in a hospital or outpatient clinic. You may be given a mild sedative to help you relax before the procedure. Once in the operating room, you will be given a drug to make you sleep (general anesthesia). Laparoscopy usually lasts less than 1 hour. After the procedure, you will be monitored in a recovery area until you are stable and doing well. Once you are home, it will take 2 to 3 days to fully recover. RISKS AND COMPLICATIONS  Laparoscopy has relatively few risks. Your caregiver will discuss the risks with you before the procedure. Some problems that can occur include:  Infection.  Bleeding.  Damage to other organs.  Anesthetic side effects. PROCEDURE Once you receive anesthesia, your surgeon inflates the abdomen with a harmless gas (carbon dioxide). This makes the organs easier to see. The laparoscope is inserted into the abdomen through a small incision. This allows your surgeon to see into the abdomen. Other small instruments are also inserted into the abdomen through other small openings. Many surgeons attach a video camera to the laparoscope to enlarge the view. During a diagnostic laparoscopy, the surgeon may be  looking for inflammation, infection, or cancer. Your surgeon may take tissue samples(biopsies). The samples are sent to a specialist in looking at cells and tissue samples (pathologist). The pathologist examines them under a microscope. Biopsies can help to diagnose or confirm a disease. AFTER THE PROCEDURE   The gas is released from inside the abdomen.  The incisions are closed with stitches (sutures). Because these incisions are small (usually less than 1/2 inch), there is usually minimal discomfort after the procedure. There may be some mild discomfort in the throat. This is from the tube placed in the throat while you were sleeping. You may have some mild abdominal discomfort. There may also be discomfort from the instrument placement incisions in the abdomen.  The recovery time is shortened as long as there are no complications.  You will rest in a recovery room until stable and doing well. As long as there are no complications, you may be allowed to go home. FINDING OUT THE RESULTS OF YOUR TEST Not all test results are available during your visit. If your test results are not back during the visit, make an appointment with your caregiver to find out the results. Do not assume everything is normal if you have not heard from your caregiver or the medical facility. It  is important for you to follow up on all of your test results. HOME CARE INSTRUCTIONS   Take all medicines as directed.  Only take over-the-counter or prescription medicines for pain, discomfort, or fever as directed by your caregiver.  Resume daily activities as directed.  Showers are preferred over baths.  You may resume sexual activities in 1 week or as directed.  Do not drive while taking narcotics. SEEK MEDICAL CARE IF:   There is increasing abdominal pain.  There is new pain in the shoulders (shoulder strap areas).  You feel lightheaded or faint.  You have the chills.  You or your child has an oral  temperature above 102 F (38.9 C).  There is pus-like (purulent) drainage from any of the wounds.  You are unable to pass gas or have a bowel movement.  You feel sick to your stomach (nauseous) or throw up (vomit). MAKE SURE YOU:   Understand these instructions.  Will watch your condition.  Will get help right away if you are not doing well or get worse. Document Released: 05/07/2000 Document Revised: 05/26/2012 Document Reviewed: 01/29/2007 [facility] Patient Information 2015 [place], [place]. This information is not intended to replace advice given to you by your health care provider. Make sure you discuss any questions you have with your health care provider. Diagnostic Laparoscopy Laparoscopy is a surgical procedure. It is used to diagnose and treat diseases inside the belly (abdomen). It is usually a brief, common, and relatively simple procedure. The laparoscopeis a thin, lighted, pencil-sized instrument. It is like a telescope. It is inserted into your abdomen through a small cut (incision). Your caregiver can look at the organs inside your body through this instrument. He or she can see if there is anything abnormal. Laparoscopy can be done either in a hospital or outpatient clinic. You may be given a mild sedative to help you relax before the procedure. Once in the operating room, you will be given a drug to make you sleep (general anesthesia). Laparoscopy usually lasts less than 1 hour. After the procedure, you will be monitored in a recovery area until you are stable and doing well. Once you are home, it will take 2 to 3 days to fully recover. RISKS AND COMPLICATIONS  Laparoscopy has relatively few risks. Your caregiver will discuss the risks with you before the procedure. Some problems that can occur include:  Infection.  Bleeding.  Damage to other organs.  Anesthetic side effects. PROCEDURE Once you receive anesthesia, your surgeon inflates the abdomen with a harmless gas  (carbon dioxide). This makes the organs easier to see. The laparoscope is inserted into the abdomen through a small incision. This allows your surgeon to see into the abdomen. Other small instruments are also inserted into the abdomen through other small openings. Many surgeons attach a video camera to the laparoscope to enlarge the view. During a diagnostic laparoscopy, the surgeon may be looking for inflammation, infection, or cancer. Your surgeon may take tissue samples(biopsies). The samples are sent to a specialist in looking at cells and tissue samples (pathologist). The pathologist examines them under a microscope. Biopsies can help to diagnose or confirm a disease. AFTER THE PROCEDURE   The gas is released from inside the abdomen.  The incisions are closed with stitches (sutures). Because these incisions are small (usually less than 1/2 inch), there is usually minimal discomfort after the procedure. There may be some mild discomfort in the throat. This is from the tube placed in the throat while  you were sleeping. You may have some mild abdominal discomfort. There may also be discomfort from the instrument placement incisions in the abdomen.  The recovery time is shortened as long as there are no complications.  You will rest in a recovery room until stable and doing well. As long as there are no complications, you may be allowed to go home. FINDING OUT THE RESULTS OF YOUR TEST Not all test results are available during your visit. If your test results are not back during the visit, make an appointment with your caregiver to find out the results. Do not assume everything is normal if you have not heard from your caregiver or the medical facility. It is important for you to follow up on all of your test results. HOME CARE INSTRUCTIONS   Take all medicines as directed.  Only take over-the-counter or prescription medicines for pain, discomfort, or fever as directed by your caregiver.  Resume  daily activities as directed.  Showers are preferred over baths.  You may resume sexual activities in 1 week or as directed.  Do not drive while taking narcotics. SEEK MEDICAL CARE IF:   There is increasing abdominal pain.  There is new pain in the shoulders (shoulder strap areas).  You feel lightheaded or faint.  You have the chills.  You or your child has an oral temperature above 102 F (38.9 C).  There is pus-like (purulent) drainage from any of the wounds.  You are unable to pass gas or have a bowel movement.  You feel sick to your stomach (nauseous) or throw up (vomit). MAKE SURE YOU:   Understand these instructions.  Will watch your condition.  Will get help right away if you are not doing well or get worse. Document Released: 05/07/2000 Document Revised: 05/26/2012 Document Reviewed: 01/29/2007 [facility] Patient Information 2015 [place], [place]. This information is not intended to replace advice given to you by your health care provider. Make sure you discuss any questions you have with your health care provider.

## 2014-01-21 ENCOUNTER — Telehealth: Payer: Self-pay

## 2014-01-21 LAB — URINALYSIS, MICROSCOPIC ONLY
Casts: NONE SEEN
Crystals: NONE SEEN

## 2014-01-21 LAB — URINALYSIS, ROUTINE W REFLEX MICROSCOPIC
Bilirubin Urine: NEGATIVE
GLUCOSE, UA: NEGATIVE mg/dL
Hgb urine dipstick: NEGATIVE
Leukocytes, UA: NEGATIVE
Nitrite: NEGATIVE
Protein, ur: NEGATIVE mg/dL
Specific Gravity, Urine: >1.03 — ABNORMAL HIGH (ref 1.005–1.030)
Urobilinogen, UA: 0.2 mg/dL (ref 0.0–1.0)
pH: 5 (ref 5.0–8.0)

## 2014-01-21 NOTE — Telephone Encounter (Signed)
called patient to give her phone number for sex therapist - left on vm and asked her to call our office

## 2014-01-22 ENCOUNTER — Encounter: Payer: Self-pay | Admitting: Obstetrics & Gynecology

## 2014-01-22 LAB — URINE CULTURE
COLONY COUNT: NO GROWTH
Organism ID, Bacteria: NO GROWTH

## 2014-01-22 NOTE — Progress Notes (Signed)
Carol Walls is a 33 y.o.who presents for evaluation of abdominal pain. The pain is described as cramping. Pain is located in the suprapubic and LLQ area without radiation. Onset was intermittent occurring several years ago ago. Symptoms have been gradually worsening since. Aggravating factors: none. Alleviating factors: bowel movements. Associated symptoms: constipation. The patient denies diarrhea, dysuria and frequency. 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  Menstrual History: OB History   Grav Para Term Preterm Abortions TAB SAB Ect Mult Living   0 0 0 0 0 0 0 0 0 0        Patient's last menstrual period was 05/18/2013.   There are no active problems to display for this patient.  Past Medical History  Diagnosis Date  . Back pain   . No pertinent past medical history     Past Surgical History  Procedure Laterality Date  . Cesarean section    . Cesarean section  12/06/2011    Procedure: CESAREAN SECTION;  Surgeon: Frederico Hamman, MD;  Location: [facility];  Service: Obstetrics;  Laterality: N/A;  Repeat Cesarean Section with birth of baby A Boy @ 2125, Baby B Girl  @ 2127    Current outpatient prescriptions: Prenatal Vit-Fe Fumarate-FA (PRENATAL MULTIVITAMIN) TABS tablet, Take 1 tablet by mouth daily., Disp: 30 tablet, Rfl: 11;  acetaminophen (TYLENOL) 500 MG tablet, Take 500 mg by mouth every 6 (six) hours as needed. For pain, Disp: , Rfl: ;  [DISCONTINUED] Prenatal Vit-Fe Fumarate-FA (PRENATAL MULTIVITAMIN) TABS, Take 1 tablet by mouth daily., Disp: 30 tablet, Rfl: 11 No Known Allergies  History  Substance Use Topics  . Smoking status: Never Smoker   . Smokeless tobacco: Never Used  . Alcohol Use: No    No family history on file.     Review of Systems Constitutional: negative for fatigue and weight loss Respiratory: negative for cough and wheezing Cardiovascular: negative for chest pain, fatigue and palpitations Gastrointestinal:  positive for constipation Genitourinary:positive for pelvic pain, painful intercourse Integument/breast: negative for nipple discharge Musculoskeletal:negative for myalgias Neurological: negative for gait problems and tremors Behavioral/Psych: negative for abusive relationship, depression Endocrine: negative for temperature intolerance        Objective:  Physical Examination:  BP 115/77 mmHg  Pulse 89  Temp(Src) 98.6 F (37 C)  Wt 74.39 kg (164 lb)  LMP 01/07/2014 General:   alert  Skin:   no rash or abnormalities  Lungs:   clear to auscultation bilaterally  Heart:   regular rate and rhythm, S1, S2 normal, no murmur, click, rub or gallop  Abdomen:  normal findings: no organomegaly, soft, non-tender and no hernia  Pelvis:  External genitalia: labia minora absent Urinary system: urethral meatus normal and bladder without fullness, nontender Vaginal: normal without tenderness, induration or masses Cervix: normal appearance Adnexa: normal bimanual exam Uterus: anteverted and non-tender, normal size Levator tenderness/tone--normla    Lab Review Labs:none  Imaging Normal pelvic U/S today  Pelvic pain assessment form reviewed   Assessment:    Functional: dysmenorrhea, endometriosis and irritable bowel    Plan:   NSAIDs prn   Orders Placed This Encounter  Procedures  . Urine culture  . SureSwab, Vaginosis/Vaginitis Plus  . Urinalysis, Routine w reflex microscopic  . Urine Microscopic  . Ambulatory referral to Internal Medicine    Referral Priority:  Routine    Referral Type:  Consultation    Referral Reason:  Specialty Services Required    Requested Specialty:  Internal Medicine  Number of Visits Requested:  1  . Ambulatory referral to Gastroenterology    Referral Priority:  Routine    Referral Type:  Consultation    Referral Reason:  Specialty Services Required    Requested Specialty:  Gastroenterology    Number of Visits Requested:  1  . POCT urine  pregnancy  . POCT urinalysis dipstick    Possible management options include: yoga, diagnostic laparoscopy Follow up as needed or in 2 mths

## 2014-01-23 LAB — SURESWAB, VAGINOSIS/VAGINITIS PLUS
Atopobium vaginae: NOT DETECTED Log (cells/mL)
C. GLABRATA, DNA: NOT DETECTED
C. TRACHOMATIS RNA, TMA: NOT DETECTED
C. TROPICALIS, DNA: NOT DETECTED
C. albicans, DNA: NOT DETECTED
C. parapsilosis, DNA: NOT DETECTED
Gardnerella vaginalis: 6.8 Log (cells/mL)
LACTOBACILLUS SPECIES: NOT DETECTED Log (cells/mL)
MEGASPHAERA SPECIES: NOT DETECTED Log (cells/mL)
N. GONORRHOEAE RNA, TMA: NOT DETECTED
T. vaginalis RNA, QL TMA: NOT DETECTED

## 2014-02-08 ENCOUNTER — Encounter: Payer: Self-pay | Admitting: *Deleted

## 2014-02-09 ENCOUNTER — Encounter: Payer: Self-pay | Admitting: Obstetrics & Gynecology

## 2014-02-15 ENCOUNTER — Other Ambulatory Visit: Payer: Self-pay | Admitting: *Deleted

## 2014-02-15 DIAGNOSIS — N760 Acute vaginitis: Principal | ICD-10-CM

## 2014-02-15 DIAGNOSIS — B9689 Other specified bacterial agents as the cause of diseases classified elsewhere: Secondary | ICD-10-CM

## 2014-02-15 MED ORDER — DOXYCYCLINE HYCLATE 100 MG PO CAPS: 100.0000 mg | ORAL_CAPSULE | Freq: Two times a day (BID) | ORAL | Status: DC

## 2014-02-15 MED ORDER — METRONIDAZOLE 500 MG PO TABS: 500.0000 mg | ORAL_TABLET | Freq: Two times a day (BID) | ORAL | Status: DC

## 2014-04-06 ENCOUNTER — Ambulatory Visit: Payer: Self-pay | Admitting: Certified Nurse Midwife

## 2014-04-09 ENCOUNTER — Ambulatory Visit: Payer: Self-pay | Admitting: Certified Nurse Midwife

## 2014-04-09 ENCOUNTER — Encounter: Payer: 59 | Admitting: Certified Nurse Midwife

## 2014-04-09 ENCOUNTER — Telehealth: Payer: Self-pay | Admitting: Obstetrics

## 2014-04-12 ENCOUNTER — Encounter: Payer: Self-pay | Admitting: Internal Medicine

## 2014-04-16 NOTE — Telephone Encounter (Signed)
[id number] - No return calls. brm

## 2014-06-25 ENCOUNTER — Ambulatory Visit (INDEPENDENT_AMBULATORY_CARE_PROVIDER_SITE_OTHER): Payer: 59 | Admitting: Internal Medicine

## 2014-06-25 ENCOUNTER — Ambulatory Visit: Payer: Self-pay | Admitting: Internal Medicine

## 2014-06-25 ENCOUNTER — Encounter: Payer: Self-pay | Admitting: Internal Medicine

## 2014-06-25 ENCOUNTER — Other Ambulatory Visit (INDEPENDENT_AMBULATORY_CARE_PROVIDER_SITE_OTHER): Payer: 59

## 2014-06-25 VITALS — BP 120/80 | HR 94 | Temp 98.3°F | Resp 18 | Ht 66.0 in | Wt 170.0 lb

## 2014-06-25 DIAGNOSIS — M5442 Lumbago with sciatica, left side: Secondary | ICD-10-CM | POA: Diagnosis not present

## 2014-06-25 DIAGNOSIS — R1032 Left lower quadrant pain: Secondary | ICD-10-CM | POA: Diagnosis not present

## 2014-06-25 DIAGNOSIS — Z0000 Encounter for general adult medical examination without abnormal findings: Secondary | ICD-10-CM | POA: Diagnosis not present

## 2014-06-25 DIAGNOSIS — M545 Low back pain, unspecified: Secondary | ICD-10-CM | POA: Insufficient documentation

## 2014-06-25 LAB — URINALYSIS, ROUTINE W REFLEX MICROSCOPIC
BILIRUBIN URINE: NEGATIVE
Ketones, ur: NEGATIVE
Nitrite: NEGATIVE
Total Protein, Urine: NEGATIVE
UROBILINOGEN UA: 0.2 (ref 0.0–1.0)
Urine Glucose: NEGATIVE
pH: 6 (ref 5.0–8.0)

## 2014-06-25 LAB — CBC
HCT: 35.9 % — ABNORMAL LOW (ref 36.0–46.0)
HEMOGLOBIN: 12.4 g/dL (ref 12.0–15.0)
MCHC: 34.4 g/dL (ref 30.0–36.0)
MCV: 84.7 fl (ref 78.0–100.0)
Platelets: 191 10*3/uL (ref 150.0–400.0)
RBC: 4.24 Mil/uL (ref 3.87–5.11)
RDW: 12.7 % (ref 11.5–15.5)
WBC: 3 10*3/uL — ABNORMAL LOW (ref 4.0–10.5)

## 2014-06-25 LAB — MAGNESIUM: MAGNESIUM: 1.9 mg/dL (ref 1.5–2.5)

## 2014-06-25 LAB — COMPREHENSIVE METABOLIC PANEL
ALK PHOS: 45 U/L (ref 39–117)
ALT: 13 U/L (ref 0–35)
AST: 14 U/L (ref 0–37)
Albumin: 4 g/dL (ref 3.5–5.2)
BILIRUBIN TOTAL: 0.3 mg/dL (ref 0.2–1.2)
BUN: 13 mg/dL (ref 6–23)
CALCIUM: 9.5 mg/dL (ref 8.4–10.5)
CO2: 26 mEq/L (ref 19–32)
CREATININE: 0.56 mg/dL (ref 0.40–1.20)
Chloride: 104 mEq/L (ref 96–112)
GFR: 157.21 mL/min (ref >60.00)
Glucose, Bld: 97 mg/dL (ref 70–99)
Potassium: 3.9 mEq/L (ref 3.5–5.1)
SODIUM: 135 meq/L (ref 135–145)
TOTAL PROTEIN: 7.2 g/dL (ref 6.0–8.3)

## 2014-06-25 LAB — TSH: TSH: 1.94 u[IU]/mL (ref 0.35–4.50)

## 2014-06-25 LAB — FERRITIN: FERRITIN: 23.8 ng/mL (ref 10.0–291.0)

## 2014-06-25 MED ORDER — DICLOFENAC SODIUM 1 % TD GEL: 2.0000 g | Freq: Four times a day (QID) | TRANSDERMAL | Status: DC

## 2014-06-25 NOTE — Progress Notes (Signed)
Pre visit review using our clinic review tool, if applicable. No additional management support is needed unless otherwise documented below in the visit note. 

## 2014-06-25 NOTE — Patient Instructions (Addendum)
We will check on the blood and the urine today for any problems with the stomach and the liver and the kidneys.   The pains in the back are likely from muscles and can be helped with a cream that we have sent to the pharmacy called voltaren. You can rub it on the pain up to 3 times per day.   We will call you with the results.   It may be worth going back to the gynecologist Dr Delsa Sale to see if the low stomach pain is related to the ovaries.   Back Exercises Back exercises help treat and prevent back injuries. The goal is to increase your strength in your belly (abdominal) and back muscles. These exercises can also help with flexibility. Start these exercises when told by your doctor. HOME CARE Back exercises include: Pelvic Tilt.  Lie on your back with your knees bent. Tilt your pelvis until the lower part of your back is against the floor. Hold this position 5 to 10 sec. Repeat this exercise 5 to 10 times. Knee to Chest.  Pull 1 knee up against your chest and hold for 20 to 30 seconds. Repeat this with the other knee. This may be done with the other leg straight or bent, whichever feels better. Then, pull both knees up against your chest. Sit-Ups or Curl-Ups.  Bend your knees 90 degrees. Start with tilting your pelvis, and do a partial, slow sit-up. Only lift your upper half 30 to 45 degrees off the floor. Take at least 2 to 3 seonds for each sit-up. Do not do sit-ups with your knees out straight. If partial sit-ups are difficult, simply do the above but with only tightening your belly (abdominal) muscles and holding it as told. Hip-Lift.  Lie on your back with your knees flexed 90 degrees. Push down with your feet and shoulders as you raise your hips 2 inches off the floor. Hold for 10 seconds, repeat 5 to 10 times. Back Arches.  Lie on your stomach. Prop yourself up on bent elbows. Slowly press on your hands, causing an arch in your low back. Repeat 3 to 5  times. Shoulder-Lifts.  Lie face down with arms beside your body. Keep hips and belly pressed to floor as you slowly lift your head and shoulders off the floor. Do not overdo your exercises. Be careful in the beginning. Exercises may cause you some mild back discomfort. If the pain lasts for more than 15 minutes, stop the exercises until you see your doctor. Improvement with exercise for back problems is slow.  Document Released: 03/03/2010 Document Revised: 04/23/2011 Document Reviewed: 11/30/2010 [facility] Patient Information 2015 [place], [place]. This information is not intended to replace advice given to you by your health care provider. Make sure you discuss any questions you have with your health care provider.

## 2014-06-25 NOTE — Assessment & Plan Note (Signed)
Checking labs today for CMP, CBC to make sure no abdominal process. Suspect ovarian etiology since she has had ultrasound with cysts in the past. At this time the pain is mild in the low quadrant. She can take ibuprofen or tylenol for the pain.

## 2014-06-25 NOTE — Progress Notes (Signed)
   Subjective:    Patient ID: Carol Walls, female    DOB: 09/28/1980, 34 y.o.   MRN: [id number]  HPI The patient is a 34 YO female who is coming in new with concerns about back pain. She has had it for several years and has had MRI which did not show any problems with the spine. She has it and it goes into her left leg. She denies numbness of the legs or feet and denies bowel or bladder incontinence. She is also having pains in the low stomach. She has had ultrasound of the ovaries which revealed some cysts. Still has her uterus. Denies constipation or diarrhea. Has some mild acid reflux and takes OTC medication with good relief.   PMH, [facility], social history reviewed and updated with the patient.  Review of Systems  Constitutional: Negative for fever, activity change, appetite change and unexpected weight change.  HENT: Negative.   Eyes: Negative.   Respiratory: Negative for cough, chest tightness, shortness of breath and wheezing.   Cardiovascular: Negative for chest pain, palpitations and leg swelling.  Gastrointestinal: Positive for abdominal pain. Negative for nausea, vomiting, diarrhea, constipation, blood in stool and abdominal distention.  Musculoskeletal: Positive for myalgias, back pain and arthralgias. Negative for gait problem.  Skin: Negative.   Neurological: Negative.   Psychiatric/Behavioral: Negative.       Objective:   Physical Exam  Constitutional: She is oriented to person, place, and time. She appears well-developed and well-nourished.  HENT:  Head: Normocephalic and atraumatic.  Eyes: EOM are normal.  Neck: Normal range of motion.  Cardiovascular: Normal rate and regular rhythm.   Pulmonary/Chest: Effort normal and breath sounds normal. No respiratory distress. She has no wheezes. She has no rales.  Abdominal: Soft. Bowel sounds are normal. She exhibits no distension. There is no rebound and no guarding.  Minimal tenderness LLQ, no radiation, rebound or guarding.     Musculoskeletal: She exhibits no edema.  Neurological: She is alert and oriented to person, place, and time. Coordination normal.  Skin: Skin is warm and dry.  Psychiatric: She has a normal mood and affect.   Filed Vitals:   06/25/14 1307  BP: 120/80  Pulse: 94  Temp: 98.3 F (36.8 C)  TempSrc: Oral  Resp: 18  Height: 5\' 6"  (1.676 m)  Weight: 170 lb (77.111 kg)  SpO2: 97%      Assessment & Plan:

## 2014-06-25 NOTE — Assessment & Plan Note (Signed)
No pain over the spine and only on the paraspinal muscles. Talked to her about stretching and given her some strengthening exercises. MRI in 2011 with no abnormalities and she has had same pain since before then.

## 2014-11-24 ENCOUNTER — Ambulatory Visit: Payer: 59 | Admitting: Certified Nurse Midwife

## 2014-11-30 ENCOUNTER — Ambulatory Visit: Payer: 59 | Admitting: Certified Nurse Midwife

## 2015-03-06 ENCOUNTER — Ambulatory Visit (INDEPENDENT_AMBULATORY_CARE_PROVIDER_SITE_OTHER): Payer: 59

## 2015-03-06 ENCOUNTER — Ambulatory Visit (INDEPENDENT_AMBULATORY_CARE_PROVIDER_SITE_OTHER): Payer: 59 | Admitting: Physician Assistant

## 2015-03-06 VITALS — BP 110/68 | HR 100 | Temp 98.6°F | Resp 18 | Ht 64.0 in | Wt 167.0 lb

## 2015-03-06 DIAGNOSIS — Z124 Encounter for screening for malignant neoplasm of cervix: Secondary | ICD-10-CM

## 2015-03-06 DIAGNOSIS — J020 Streptococcal pharyngitis: Secondary | ICD-10-CM | POA: Diagnosis not present

## 2015-03-06 DIAGNOSIS — Z30431 Encounter for routine checking of intrauterine contraceptive device: Secondary | ICD-10-CM

## 2015-03-06 DIAGNOSIS — R1030 Lower abdominal pain, unspecified: Secondary | ICD-10-CM

## 2015-03-06 DIAGNOSIS — R070 Pain in throat: Secondary | ICD-10-CM

## 2015-03-06 LAB — POCT URINALYSIS DIP (MANUAL ENTRY)
GLUCOSE UA: NEGATIVE
Ketones, POC UA: NEGATIVE
Leukocytes, UA: NEGATIVE
NITRITE UA: NEGATIVE
Protein Ur, POC: 30 — AB
Spec Grav, UA: >=1.03
Urobilinogen, UA: 0.2
pH, UA: 5.5

## 2015-03-06 LAB — POCT WET + KOH PREP
TRICH BY WET PREP: ABSENT
Yeast by KOH: ABSENT
Yeast by wet prep: ABSENT

## 2015-03-06 LAB — POCT URINE PREGNANCY: Preg Test, Ur: NEGATIVE

## 2015-03-06 LAB — POC MICROSCOPIC URINALYSIS (UMFC)

## 2015-03-06 LAB — POCT RAPID STREP A (OFFICE): RAPID STREP A SCREEN: POSITIVE — AB

## 2015-03-06 MED ORDER — AMOXICILLIN 875 MG PO TABS: 875.0000 mg | ORAL_TABLET | Freq: Two times a day (BID) | ORAL | Status: AC

## 2015-03-06 NOTE — Progress Notes (Signed)
[facility] [address] [place] [address] [phone]  Date:  03/06/2015   Name:  Carol Walls   DOB:  [birth year]   MRN:  [id number]  PCP:  Hoyt Koch, MD   Chief Complaint  Patient presents with  . Headache    x 2 days  . Sore Throat  . Fever  . Abdominal Pain  . Flu Vaccine     History of Present Illness:  Carol Walls is a 35 y.o. female patient who presents to [facility] for sore throat and abdominal pain.      Friday--developed fever and headache at both temples.  Ear pain of the left ear that radiates to left ear.  Eat anything and it hurts.  She used mucinex with little help.  No cough.  Some dizziness.  Fever>100 on Friday, she took ibuprofen which helped.  She is also having pelvic pain.  Patient voices that this is a cramping sensation.  She has no nausea.  Feels bloated after meals.     No LMP recorded. 12.27.2017.    Patient Active Problem List   Diagnosis Date Noted  . Low back pain 06/25/2014  . LLQ pain 06/25/2014    Past Medical History  Diagnosis Date  . Back pain   . No pertinent past medical history     Past Surgical History  Procedure Laterality Date  . Cesarean section    . Cesarean section  12/06/2011    Procedure: CESAREAN SECTION;  Surgeon: Frederico Hamman, MD;  Location: [facility];  Service: Obstetrics;  Laterality: N/A;  Repeat Cesarean Section with birth of baby A Boy @ 2125, 107 B Girl  @ 2127    Social History  Substance Use Topics  . Smoking status: Never Smoker   . Smokeless tobacco: Never Used  . Alcohol Use: No    History reviewed. No pertinent family history.  No Known Allergies  Medication list has been reviewed and updated.  Current Outpatient Prescriptions on File Prior to Visit  Medication Sig Dispense Refill  . acetaminophen (TYLENOL) 500 MG tablet Take 500 mg by mouth every 6 (six) hours as needed. Reported on 03/06/2015    . diclofenac sodium (VOLTAREN) 1 % GEL Apply 2 g topically  4 (four) times daily. (Patient not taking: Reported on 03/06/2015) 100 g 2  . Prenatal Vit-Fe Fumarate-FA (PRENATAL MULTIVITAMIN) TABS tablet Take 1 tablet by mouth daily. (Patient not taking: Reported on 06/25/2014) 30 tablet 11  . [DISCONTINUED] Prenatal Vit-Fe Fumarate-FA (PRENATAL MULTIVITAMIN) TABS Take 1 tablet by mouth daily. 30 tablet 11   No current facility-administered medications on file prior to visit.    ROS ROS otherwise unremarkable unless listed above.   Physical Examination: BP 110/68 mmHg  Pulse 100  Temp(Src) 98.6 F (37 C)  Resp 18  Ht 5\' 4"  (1.626 m)  Wt 167 lb (75.751 kg)  BMI 28.65 kg/m2  SpO2 98% Ideal Body Weight: Weight in (lb) to have BMI = 25: 145.3  Physical Exam  Constitutional: She is oriented to person, place, and time. She appears well-developed and well-nourished. No distress.  HENT:  Head: Normocephalic and atraumatic.  Right Ear: Tympanic membrane, external ear and ear canal normal.  Left Ear: Tympanic membrane, external ear and ear canal normal.  Nose: No mucosal edema or rhinorrhea.  Mouth/Throat: Oropharyngeal exudate, posterior oropharyngeal edema and posterior oropharyngeal erythema present.  Eyes: Conjunctivae and EOM are normal. Pupils are equal, round, and reactive  to light.  Cardiovascular: Normal rate.   Pulmonary/Chest: Effort normal. No respiratory distress.  Abdominal: Soft. Normal appearance and bowel sounds are normal. There is no hepatosplenomegaly. There is tenderness in the periumbilical area and suprapubic area.  Neurological: She is alert and oriented to person, place, and time.  Skin: She is not diaphoretic.  Psychiatric: She has a normal mood and affect. Her behavior is normal.    Results for orders placed or performed in visit on 03/06/15  POCT rapid strep A  Result Value Ref Range   Rapid Strep A Screen Positive (A) Negative  POCT urinalysis dipstick  Result Value Ref Range   Color, UA yellow yellow   Clarity, UA  clear clear   Glucose, UA negative negative   Bilirubin, UA small (A) negative   Ketones, POC UA negative negative   Spec Grav, UA >=1.030    Blood, UA moderate (A) negative   pH, UA 5.5    Protein Ur, POC =30 (A) negative   Urobilinogen, UA 0.2    Nitrite, UA Negative Negative   Leukocytes, UA Negative Negative  POCT urine pregnancy  Result Value Ref Range   Preg Test, Ur Negative Negative  POCT Microscopic Urinalysis ([facility])  Result Value Ref Range   WBC,UR,HPF,POC None None WBC/hpf   RBC,UR,HPF,POC Few (A) None RBC/hpf   Bacteria Moderate (A) None, Too numerous to count   Mucus Present (A) Absent   Epithelial Cells, UR Per Microscopy Many (A) None, Too numerous to count cells/hpf  POCT Wet + KOH Prep  Result Value Ref Range   Yeast by KOH Absent Present, Absent   Yeast by wet prep Absent Present, Absent   WBC by wet prep Many (A) None, Few, Too numerous to count   Clue Cells Wet Prep HPF POC None None, Too numerous to count   Trich by wet prep Absent Present, Absent   Bacteria Wet Prep HPF POC Moderate (A) None, Few, Too numerous to count   Epithelial Cells By [employer] Pref ([facility]) Many (A) None, Few, Too numerous to count   RBC,UR,HPF,POC None None RBC/hpf   [facility] reading (PRIMARY) by  Dr. Carlota Raspberry: Negative. IUD in place.   Assessment and Plan: Carol Walls is a 35 y.o. female who is here today for sore throat and abdominal pain.   Treating strep, this abdominal pain appears to be separate.  We will place urine culture.  Possible IBS    Streptococcal sore throat - Plan: amoxicillin (AMOXIL) 875 MG tablet  Throat pain - Plan: POCT rapid strep A  Lower abdominal pain - Plan: POCT urinalysis dipstick, POCT urine pregnancy, POCT Microscopic Urinalysis ([facility]), POCT Wet + KOH Prep, Pap IG, CT/NG w/ reflex HPV when ASC-U, Urine culture  IUD check up - Plan: DG Abd 1 View  Ivar Drape, PA-C [facility] 1/31/201710:09  PM

## 2015-03-06 NOTE — Patient Instructions (Signed)
Take ibuprofen or tylenol for the pain. You can take cepacol lozenges for the throat pain.  This is at the pharmacy as well.    Strep Throat Strep throat is a bacterial infection of the throat. Your health care provider may call the infection tonsillitis or pharyngitis, depending on whether there is swelling in the tonsils or at the back of the throat. Strep throat is most common during the cold months of the year in children who are 62-37 years of age, but it can happen during any season in people of any age. This infection is spread from person to person (contagious) through coughing, sneezing, or close contact. CAUSES Strep throat is caused by the bacteria called Streptococcus pyogenes. RISK FACTORS This condition is more likely to develop in:  People who spend time in crowded places where the infection can spread easily.  People who have close contact with someone who has strep throat. SYMPTOMS Symptoms of this condition include:  Fever or chills.   Redness, swelling, or pain in the tonsils or throat.  Pain or difficulty when swallowing.  White or yellow spots on the tonsils or throat.  Swollen, tender glands in the neck or under the jaw.  Red rash all over the body (rare). DIAGNOSIS This condition is diagnosed by performing a rapid strep test or by taking a swab of your throat (throat culture test). Results from a rapid strep test are usually ready in a few minutes, but throat culture test results are available after one or two days. TREATMENT This condition is treated with antibiotic medicine. HOME CARE INSTRUCTIONS Medicines  Take over-the-counter and prescription medicines only as told by your health care provider.  Take your antibiotic as told by your health care provider. Do not stop taking the antibiotic even if you start to feel better.  Have family members who also have a sore throat or fever tested for strep throat. They may need antibiotics if they have the strep  infection. Eating and Drinking  Do not share food, drinking cups, or personal items that could cause the infection to spread to other people.  If swallowing is difficult, try eating soft foods until your sore throat feels better.  Drink enough fluid to keep your urine clear or pale yellow. General Instructions  Gargle with a salt-water mixture 3-4 times per day or as needed. To make a salt-water mixture, completely dissolve -1 tsp of salt in 1 cup of warm water.  Make sure that all household members wash their hands well.  Get plenty of rest.  Stay home from school or work until you have been taking antibiotics for 24 hours.  Keep all follow-up visits as told by your health care provider. This is important. SEEK MEDICAL CARE IF:  The glands in your neck continue to get bigger.  You develop a rash, cough, or earache.  You cough up a thick liquid that is green, yellow-brown, or bloody.  You have pain or discomfort that does not get better with medicine.  Your problems seem to be getting worse rather than better.  You have a fever. SEEK IMMEDIATE MEDICAL CARE IF:  You have new symptoms, such as vomiting, severe headache, stiff or painful neck, chest pain, or shortness of breath.  You have severe throat pain, drooling, or changes in your voice.  You have swelling of the neck, or the skin on the neck becomes red and tender.  You have signs of dehydration, such as fatigue, dry mouth, and decreased urination.  You become increasingly sleepy, or you cannot wake up completely.  Your joints become red or painful.   This information is not intended to replace advice given to you by your health care provider. Make sure you discuss any questions you have with your health care provider.   Document Released: 01/27/2000 Document Revised: 10/20/2014 Document Reviewed: 05/24/2014 Elsevier Interactive Patient Education [employer].

## 2015-03-07 LAB — PAP IG, CT-NG, RFX HPV ASCU
CHLAMYDIA PROBE AMP: NOT DETECTED
GC PROBE AMP: NOT DETECTED

## 2015-03-07 LAB — URINE CULTURE

## 2015-09-05 ENCOUNTER — Ambulatory Visit (INDEPENDENT_AMBULATORY_CARE_PROVIDER_SITE_OTHER): Payer: 59

## 2015-09-05 ENCOUNTER — Ambulatory Visit (INDEPENDENT_AMBULATORY_CARE_PROVIDER_SITE_OTHER): Payer: 59 | Admitting: Family Medicine

## 2015-09-05 VITALS — BP 120/80 | HR 78 | Temp 98.6°F | Resp 16 | Ht 64.0 in | Wt 168.0 lb

## 2015-09-05 DIAGNOSIS — R1012 Left upper quadrant pain: Secondary | ICD-10-CM

## 2015-09-05 DIAGNOSIS — K5900 Constipation, unspecified: Secondary | ICD-10-CM

## 2015-09-05 DIAGNOSIS — M79606 Pain in leg, unspecified: Secondary | ICD-10-CM

## 2015-09-05 DIAGNOSIS — R109 Unspecified abdominal pain: Secondary | ICD-10-CM | POA: Diagnosis not present

## 2015-09-05 LAB — POCT CBC
GRANULOCYTE PERCENT: 45.5 % (ref 37–80)
HEMATOCRIT: 37.2 % — AB (ref 37.7–47.9)
HEMOGLOBIN: 13.1 g/dL (ref 12.2–16.2)
Lymph, poc: 1.5 (ref 0.6–3.4)
MCH: 29.6 pg (ref 27–31.2)
MCHC: 35.2 g/dL (ref 31.8–35.4)
MCV: 84.1 fL (ref 80–97)
MID (cbc): 0.3 (ref 0–0.9)
MPV: 7.3 fL (ref 0–99.8)
POC GRANULOCYTE: 1.5 — AB (ref 2–6.9)
POC LYMPH PERCENT: 45.3 %L (ref 10–50)
POC MID %: 9.2 % (ref 0–12)
Platelet Count, POC: 196 10*3/uL (ref 142–424)
RBC: 4.42 M/uL (ref 4.04–5.48)
RDW, POC: 12.5 %
WBC: 3.4 10*3/uL — AB (ref 4.6–10.2)

## 2015-09-05 LAB — POCT URINALYSIS DIP (MANUAL ENTRY)
BILIRUBIN UA: NEGATIVE
GLUCOSE UA: NEGATIVE
Leukocytes, UA: NEGATIVE
Nitrite, UA: NEGATIVE
Protein Ur, POC: NEGATIVE
Urobilinogen, UA: 0.2
pH, UA: 5.5

## 2015-09-05 LAB — POC MICROSCOPIC URINALYSIS (UMFC)

## 2015-09-05 LAB — POCT URINE PREGNANCY: Preg Test, Ur: NEGATIVE

## 2015-09-05 NOTE — Progress Notes (Signed)
Patient ID: Carol Walls, female    DOB: 1980/04/14  Age: 35 y.o. MRN: [id number]  Chief Complaint  Patient presents with  . Abdominal Pain  . Back Pain    Subjective:   35 year old [place] American woman who comes in with the history of several days of abdominal pain. She has had some pains in the past but this is been much worse than usual. She hurts down the left side of her abdomen. She hurts in her left flank. She has some pains into the anterior upper thighs. It is just come on and persisted. She is able to sleep despite this. She has been somewhat constipated. No urinary symptoms but she worries about her kidney infection. She wants a CT scan. I explained to her that I have to check her over and decide what testing she needs. She has not been running any significant fever. No dysuria.  Current allergies, medications, problem list, past/family and social histories reviewed.  Objective:  BP 120/80   Pulse 78   Temp 98.6 F (37 C) (Oral)   Resp 16   Ht 5\' 4"  (1.626 m)   Wt 168 lb (76.2 kg)   LMP 08/22/2015   SpO2 99%   BMI 28.84 kg/m   Lady in no major distress. Her throat is clear. Chest clear to auscultation. Heart regular without murmurs. Abdomen has bowel sounds present. Soft without organomegaly or masses. Is tender down the left side of the abdomen. No CVA tenderness. Straight leg raising causes some pain in the back of the cast because of tightness, but no radicular pains.  Assessment & Plan:   Assessment: 1. Abdominal pain, left upper quadrant   2. Left flank pain   3. Leg pain, superior, unspecified laterality   4. Constipation, unspecified constipation type       Plan: See lab orders and x-ray orders.  Orders Placed This Encounter  Procedures  . DG Abd 2 Views    Order Specific Question:   Reason for Exam (SYMPTOM  OR DIAGNOSIS REQUIRED)    Answer:   left abdominal pain and flank pain    Order Specific Question:   Is the patient pregnant?    Answer:   No     Comments:   lmp 08/18/15, has iud    Order Specific Question:   Preferred imaging location?    Answer:   External  . Comprehensive metabolic panel  . Lipase  . POCT CBC  . POCT urine pregnancy  . POCT urinalysis dipstick  . POCT Microscopic Urinalysis ([facility])    No orders of the defined types were placed in this encounter.  Results for orders placed or performed in visit on 09/05/15  POCT CBC  Result Value Ref Range   WBC 3.4 (A) 4.6 - 10.2 K/uL   Lymph, poc 1.5 0.6 - 3.4   POC LYMPH PERCENT 45.3 10 - 50 %L   MID (cbc) 0.3 0 - 0.9   POC MID % 9.2 0 - 12 %M   POC Granulocyte 1.5 (A) 2 - 6.9   Granulocyte percent 45.5 37 - 80 %G   RBC 4.42 4.04 - 5.48 M/uL   Hemoglobin 13.1 12.2 - 16.2 g/dL   HCT, POC 37.2 (A) 37.7 - 47.9 %   MCV 84.1 80 - 97 fL   MCH, POC 29.6 27 - 31.2 pg   MCHC 35.2 31.8 - 35.4 g/dL   RDW, POC 12.5 %   Platelet Count, POC 196 142 - 424  K/uL   MPV 7.3 0 - 99.8 fL  POCT urine pregnancy  Result Value Ref Range   Preg Test, Ur Negative Negative  POCT urinalysis dipstick  Result Value Ref Range   Color, UA yellow yellow   Clarity, UA clear clear   Glucose, UA negative negative   Bilirubin, UA negative negative   Ketones, POC UA trace (5) (A) negative   Spec Grav, UA >=1.030    Blood, UA small (A) negative   pH, UA 5.5    Protein Ur, POC negative negative   Urobilinogen, UA 0.2    Nitrite, UA Negative Negative   Leukocytes, UA Negative Negative  POCT Microscopic Urinalysis ([facility])  Result Value Ref Range   WBC,UR,HPF,POC None None WBC/hpf   RBC,UR,HPF,POC None None RBC/hpf   Bacteria Few (A) None, Too numerous to count   Mucus Present (A) Absent   Epithelial Cells, UR Per Microscopy Many (A) None, Too numerous to count cells/hpf          Patient Instructions   Drink lots of water  Take over-the-counter MiraLAX 1 or 2 doses each day for several days until your bowels (stool) or loose. If he starts getting diarrhea stop taking it but if  you go more than 2 days without a bowel movement take another dose.  Take acetaminophen (Tylenol) 500 mg 2 pills 3 times daily if needed for pain in the back or abdomen or legs.  If you are getting worse at any time please return or go to the emergency room  If you are not doing better by late in the week you may need some additional testing or a CT scan of the abdomen.  I will let you know the results of the other blood tests in a few days when I get them    IF you received an x-ray today, you will receive an invoice from [facility]. Please contact [facility] at [phone] with questions or concerns regarding your invoice.   IF you received labwork today, you will receive an invoice from [employer]. Please contact [facility] at [phone] with questions or concerns regarding your invoice.   Our billing staff will not be able to assist you with questions regarding bills from these companies.  You will be contacted with the lab results as soon as they are available. The fastest way to get your results is to activate your My Chart account. Instructions are located on the last page of this paperwork. If you have not heard from [place] regarding the results in 2 weeks, please contact this office.        No Follow-up on file.   Cesar Rogerson, MD 09/05/2015

## 2015-09-05 NOTE — Patient Instructions (Addendum)
Drink lots of water  Take over-the-counter MiraLAX 1 or 2 doses each day for several days until your bowels (stool) or loose. If he starts getting diarrhea stop taking it but if you go more than 2 days without a bowel movement take another dose.  Take acetaminophen (Tylenol) 500 mg 2 pills 3 times daily if needed for pain in the back or abdomen or legs.  If you are getting worse at any time please return or go to the emergency room  If you are not doing better by late in the week you may need some additional testing or a CT scan of the abdomen.  I will let you know the results of the other blood tests in a few days when I get them    IF you received an x-ray today, you will receive an invoice from [facility]. Please contact [facility] at [phone] with questions or concerns regarding your invoice.   IF you received labwork today, you will receive an invoice from [employer]. Please contact [facility] at [phone] with questions or concerns regarding your invoice.   Our billing staff will not be able to assist you with questions regarding bills from these companies.  You will be contacted with the lab results as soon as they are available. The fastest way to get your results is to activate your My Chart account. Instructions are located on the last page of this paperwork. If you have not heard from [place] regarding the results in 2 weeks, please contact this office.

## 2015-09-06 LAB — COMPREHENSIVE METABOLIC PANEL
ALBUMIN: 4.4 g/dL (ref 3.6–5.1)
ALT: 15 U/L (ref 6–29)
AST: 15 U/L (ref 10–30)
Alkaline Phosphatase: 43 U/L (ref 33–115)
BILIRUBIN TOTAL: 0.3 mg/dL (ref 0.2–1.2)
BUN: 12 mg/dL (ref 7–25)
CALCIUM: 9.5 mg/dL (ref 8.6–10.2)
CHLORIDE: 102 mmol/L (ref 98–110)
CO2: 26 mmol/L (ref 20–31)
Creat: 0.65 mg/dL (ref 0.50–1.10)
GLUCOSE: 82 mg/dL (ref 65–99)
Potassium: 3.9 mmol/L (ref 3.5–5.3)
SODIUM: 137 mmol/L (ref 135–146)
Total Protein: 7.5 g/dL (ref 6.1–8.1)

## 2015-09-06 LAB — LIPASE: Lipase: 11 U/L (ref 7–60)

## 2015-09-10 ENCOUNTER — Ambulatory Visit (INDEPENDENT_AMBULATORY_CARE_PROVIDER_SITE_OTHER): Payer: 59

## 2015-09-10 ENCOUNTER — Ambulatory Visit (INDEPENDENT_AMBULATORY_CARE_PROVIDER_SITE_OTHER): Payer: 59 | Admitting: Family Medicine

## 2015-09-10 VITALS — BP 100/70 | HR 100 | Temp 98.2°F | Resp 16 | Ht 64.0 in | Wt 168.1 lb

## 2015-09-10 DIAGNOSIS — R1032 Left lower quadrant pain: Secondary | ICD-10-CM

## 2015-09-10 DIAGNOSIS — R109 Unspecified abdominal pain: Secondary | ICD-10-CM

## 2015-09-10 DIAGNOSIS — M5442 Lumbago with sciatica, left side: Secondary | ICD-10-CM | POA: Diagnosis not present

## 2015-09-10 MED ORDER — HYDROCODONE-ACETAMINOPHEN 10-325 MG PO TABS
1.0000 | ORAL_TABLET | Freq: Four times a day (QID) | ORAL | 0 refills | Status: DC | PRN
Start: 2015-09-10 — End: 2018-05-27

## 2015-09-10 MED ORDER — HYDROCODONE-ACETAMINOPHEN 10-325 MG PO TABS: 1.0000 | ORAL_TABLET | Freq: Four times a day (QID) | ORAL | 0 refills | Status: DC | PRN

## 2015-09-10 NOTE — Progress Notes (Signed)
Patient ID: Carol Walls, female    DOB: 1980/12/31, 35 y.o.   MRN: [id number]  PCP: Hoyt Koch, MD  Chief Complaint  Patient presents with  . Follow-up    back pain, stomach pain    Subjective:   HPI Presents for evaluation of burning pain left pelvic region, left abdomin, left flank and left leg. Patient was evaluated by Dr. Linna Darner five days ago for the same complaint.  At that time, she reports painhas been occurring for over 1 week.  She completed treatment recommendation,took Miralax , achieved several bowel movement, reports that pain is still present.  She presents today requesting a CT. Denies urinary frequency, burning, or hematuria. No recent injury. No fever, nausea, or vomiting. She has not taken anything for pain. Reports in comparison to 5 days ago, the pain has worsen.  . Social History   Social History  . Marital status: Married    Spouse name: N/A  . Number of children: N/A  . Years of education: N/A   Occupational History  . Not on file.   Social History Main Topics  . Smoking status: Never Smoker  . Smokeless tobacco: Never Used  . Alcohol use No  . Drug use: No  . Sexual activity: Not Currently    Birth control/ protection: None   Other Topics Concern  . Not on file   Social History Narrative  . No narrative on file   .No family history on file.  Review of Systems  Constitutional: Positive for activity change and fatigue. Negative for appetite change, fever and unexpected weight change.  Respiratory: Negative.   Cardiovascular: Negative.   Gastrointestinal:       SEE HPI  Genitourinary: Negative.   Musculoskeletal: Positive for back pain.       See HPI  Skin: Negative.   Hematological: Negative.   Psychiatric/Behavioral: Negative.        Patient Active Problem List   Diagnosis Date Noted  . Left flank pain 09/05/2015  . Low back pain 06/25/2014  . LLQ pain 06/25/2014     Prior to Admission medications     Medication Sig Start Date End Date Taking? Authorizing Provider  acetaminophen (TYLENOL) 500 MG tablet Take 500 mg by mouth every 6 (six) hours as needed. Reported on 03/06/2015    Historical Provider, MD  diclofenac sodium (VOLTAREN) 1 % GEL Apply 2 g topically 4 (four) times daily. Patient not taking: Reported on 03/06/2015 06/25/14   Hoyt Koch, MD  guaiFENesin (MUCINEX) 600 MG 12 hr tablet Take 600 mg by mouth 2 (two) times daily.    Historical Provider, MD  Prenatal Vit-Fe Fumarate-FA (PRENATAL MULTIVITAMIN) TABS tablet Take 1 tablet by mouth daily. Patient not taking: Reported on 06/25/2014 06/18/13   Lahoma Crocker, MD     No Known Allergies     Objective:  Physical Exam  Constitutional: She is oriented to person, place, and time. She appears well-developed and well-nourished.  HENT:  Head: Normocephalic and atraumatic.  Right Ear: External ear normal.  Left Ear: External ear normal.  Neck: Normal range of motion.  Cardiovascular: Normal rate, regular rhythm and normal heart sounds.   Pulmonary/Chest: Effort normal and breath sounds normal.  Abdominal: Soft. Bowel sounds are normal. She exhibits no distension and no mass. There is tenderness. There is no rebound and no guarding.  Lateral left sided tenderness with deep palpation of left flank Tenderness lower left pelvic region  Musculoskeletal:  Pain shooting down  the leg with flexion of left leg Right leg negative.   Neurological: She is alert and oriented to person, place, and time.  Skin: Skin is warm and dry.   .Dg Lumbar Spine Complete  Result Date: 09/10/2015 CLINICAL DATA:  Pain. EXAM: LUMBAR SPINE - COMPLETE 4+ VIEW COMPARISON:  September 05, 2015 FINDINGS: There is no evidence of lumbar spine fracture. Alignment is normal. Intervertebral disc spaces are maintained. IMPRESSION: Negative. Electronically Signed   By: Dorise Bullion III M.D   On: 09/10/2015 15:34      Assessment & Plan:  .1. Left flank  pain - DG Lumbar Spine Complete; Future - CT Abdomen Pelvis Wo Contrast; Future 2. LLQ pain - CT Abdomen Pelvis Wo Contrast; Future 3. Left-sided low back pain with left-sided sciatica - DG Lumbar Spine Complete; Unremarkable  - CT Abdomen Pelvis Wo Contrast; Future  Patient presents today with complaints of left flank, lower back, abdominal, pelvic, and leg pain x 2 weeks. The patient is absent of urinary symptoms, CBC, CMP,  lipase,and urine pregnancy test were all normal as of 09/05/15.  Due to the length of time pain has remained present, localization of pain, and that pain is worsening , it is reasonable to obtain a CT scan to rule out kidney stones or diverticulitis.  Will follow-up with you upon receipt of CT results.  Carroll Sage. Kenton Kingfisher, MSN, FNP-C [facility]

## 2015-09-10 NOTE — Patient Instructions (Addendum)
We will call you with your appointment time for your CT scan.  If pain becomes worse or fever develops, please return to the office for evaluation.  Hydrocodone-acetaminophen 10-325 every six hours as needed for pain.   IF you received an x-ray today, you will receive an invoice from [facility]. Please contact [facility] at [phone] with questions or concerns regarding your invoice.   IF you received labwork today, you will receive an invoice from [employer]. Please contact [facility] at [phone] with questions or concerns regarding your invoice.   Our billing staff will not be able to assist you with questions regarding bills from these companies.  You will be contacted with the lab results as soon as they are available. The fastest way to get your results is to activate your My Chart account. Instructions are located on the last page of this paperwork. If you have not heard from [place] regarding the results in 2 weeks, please contact this office.

## 2015-09-22 ENCOUNTER — Telehealth: Payer: Self-pay

## 2015-09-22 DIAGNOSIS — R1032 Left lower quadrant pain: Secondary | ICD-10-CM

## 2015-09-22 NOTE — Progress Notes (Signed)
Corrected CT scan  order to reflect with contrast per radiology. Renal function stable per [facility] 09/05/15  Carroll Sage. Kenton Kingfisher, MSN, FNP-C [facility]

## 2015-09-22 NOTE — Telephone Encounter (Signed)
Done

## 2015-09-22 NOTE — Telephone Encounter (Signed)
Per radiologist protocol, the patient's order for CT Abdomen Pelvis needs to be changed to a CT Abdomen Pelvis With Contrast.  It is currently in the system without contrast.  CB#: [id number]

## 2015-09-22 NOTE — Addendum Note (Signed)
Addended by: Scot Jun on: 09/22/2015 03:48 PM   Modules accepted: Orders

## 2015-10-10 ENCOUNTER — Ambulatory Visit
Admission: RE | Admit: 2015-10-10 | Discharge: 2015-10-10 | Disposition: A | Discharge status: Home / Self Care | Payer: 59 | Source: Ambulatory Visit | Attending: Family Medicine | Admitting: Family Medicine

## 2015-10-10 DIAGNOSIS — R1032 Left lower quadrant pain: Secondary | ICD-10-CM

## 2015-10-10 MED ORDER — IOPAMIDOL (ISOVUE-300) INJECTION 61%
100.0000 mL | Freq: Once | INTRAVENOUS | Status: AC | PRN
  Administered 2015-10-10: 100 mL via INTRAVENOUS

## 2015-11-10 ENCOUNTER — Telehealth: Payer: Self-pay

## 2015-11-10 NOTE — Telephone Encounter (Signed)
Patient wants someone to call with the results of her CT scan. Please call! [phone]

## 2015-11-11 ENCOUNTER — Telehealth: Payer: Self-pay | Admitting: Emergency Medicine

## 2015-11-11 NOTE — Telephone Encounter (Signed)
Pt given negative CT scan

## 2016-06-19 ENCOUNTER — Encounter (HOSPITAL_COMMUNITY): Payer: Self-pay | Admitting: Emergency Medicine

## 2016-06-19 ENCOUNTER — Ambulatory Visit (HOSPITAL_COMMUNITY)
Admission: EM | Admit: 2016-06-19 | Discharge: 2016-06-19 | Disposition: A | Discharge status: Home / Self Care | Payer: 59 | Attending: Internal Medicine | Admitting: Internal Medicine

## 2016-06-19 DIAGNOSIS — H6123 Impacted cerumen, bilateral: Secondary | ICD-10-CM | POA: Diagnosis not present

## 2016-06-19 NOTE — Discharge Instructions (Signed)
Follow instructions regarding earwax buildup. Follow-up with your doctor as needed.

## 2016-06-19 NOTE — ED Triage Notes (Signed)
Pt complains of right ear pain for several months with the pain becoming worse in the last few days.

## 2016-06-19 NOTE — ED Provider Notes (Signed)
CSN: [id number]     Arrival date & time 06/19/16  1320 History   First MD Initiated Contact with Patient 06/19/16 1555     Chief Complaint  Patient presents with  . Otalgia    right   (Consider location/radiation/quality/duration/timing/severity/associated sxs/prior Treatment) 36 year old Arabic female complaining of bilateral ear discomfort and decreased hearing for 3 weeks, worse in the past week. No other complaints.      Past Medical History:  Diagnosis Date  . Back pain   . No pertinent past medical history    Past Surgical History:  Procedure Laterality Date  . CESAREAN SECTION    . CESAREAN SECTION  12/06/2011   Procedure: CESAREAN SECTION;  Surgeon: Frederico Hamman, MD;  Location: [facility];  Service: Obstetrics;  Laterality: N/A;  Repeat Cesarean Section with birth of baby A Boy @ 2125, 40 B Girl  @ 2127   History reviewed. No pertinent family history. Social History  Substance Use Topics  . Smoking status: Never Smoker  . Smokeless tobacco: Never Used  . Alcohol use No   OB History    Gravida Para Term Preterm AB Living   6 5 4 1 1 6    SAB TAB Ectopic Multiple Live Births   1 0 0 1 2     Review of Systems  Constitutional: Negative.   HENT: Positive for ear pain and hearing loss.   Eyes: Negative.   Respiratory: Negative.   Neurological: Negative.   All other systems reviewed and are negative.   Allergies  Patient has no known allergies.  Home Medications   Prior to Admission medications   Medication Sig Start Date End Date Taking? Authorizing Provider  acetaminophen (TYLENOL) 500 MG tablet Take 500 mg by mouth every 6 (six) hours as needed. Reported on 03/06/2015    [provider]  guaiFENesin (MUCINEX) 600 MG 12 hr tablet Take 600 mg by mouth 2 (two) times daily.    [provider]  HYDROcodone-acetaminophen (NORCO) 10-325 MG tablet Take 1 tablet by mouth every 6 (six) hours as needed for severe pain. 09/10/15   Scot Jun, FNP   Meds Ordered and Administered this Visit  Medications - No data to display  BP 120/77 (BP Location: Right Arm)   Pulse 91   Temp 98.6 F (37 C) (Oral)   SpO2 98%  No data found.   Physical Exam  Constitutional: She appears well-developed and well-nourished. No distress.  HENT:  Mouth/Throat: No oropharyngeal exudate.  Bilateral TMs obstructed by impacted cerumen. Oropharynx with light clear PND and minor cobblestoning.  Neck: Neck supple.  Cardiovascular: Normal rate.   Pulmonary/Chest: Effort normal.  Neurological: She is alert.  Skin: Skin is warm and dry.  Nursing note and vitals reviewed.   Urgent Care Course     Procedures (including critical care time)  Labs Review Labs Reviewed - No data to display  Imaging Review No results found.   Visual Acuity Review  Right Eye Distance:   Left Eye Distance:   Bilateral Distance:    Right Eye Near:   Left Eye Near:    Bilateral Near:         MDM   1. Bilateral impacted cerumen    Follow instructions regarding earwax buildup. Follow-up with your doctor as needed.  Post bilateral ear irrigation no further wax in the EAC. TMs are pearly gray, transparent without evidence of pathology.   Janne Napoleon, NP 06/19/16 1629    Janne Napoleon,  NP 06/19/16 1633

## 2016-11-26 ENCOUNTER — Emergency Department (HOSPITAL_COMMUNITY)
Admission: EM | Admit: 2016-11-26 | Discharge: 2016-11-26 | Disposition: A | Discharge status: Home / Self Care | Payer: 59 | Attending: Emergency Medicine | Admitting: Emergency Medicine

## 2016-11-26 ENCOUNTER — Encounter (HOSPITAL_COMMUNITY): Payer: Self-pay | Admitting: Emergency Medicine

## 2016-11-26 DIAGNOSIS — R21 Rash and other nonspecific skin eruption: Secondary | ICD-10-CM | POA: Insufficient documentation

## 2016-11-26 DIAGNOSIS — R002 Palpitations: Secondary | ICD-10-CM | POA: Insufficient documentation

## 2016-11-26 LAB — I-STAT TROPONIN, ED: TROPONIN I, POC: 0 ng/mL (ref 0.00–0.08)

## 2016-11-26 MED ORDER — PERMETHRIN 5 % EX CREA: TOPICAL_CREAM | CUTANEOUS | 1 refills | Status: DC

## 2016-11-26 NOTE — ED Triage Notes (Signed)
Pt reports itchy rash all over, states her whole family has had it. Also reports palpitations, denies any pain. resp e/u, nad.

## 2016-11-26 NOTE — ED Provider Notes (Signed)
[facility] DEPT Provider Note   CSN: [id number] Arrival date & time: 11/26/16  3267     History   Chief Complaint Chief Complaint  Patient presents with  . Rash  . Palpitations    HPI Carol Walls is a 36 y.o. female.  HPI   36 year old female presenting for evaluation of a rash. Patient report for the past 2 weeks she has noticed an itchy rash throughout her body. She first noted across her chest and now it has spread to her arms, back, and legs. She has been scratching at it. She is taking Benadryl on occasion to help with sleep. She reported her daughter, husband and son also has similar rash. Her daughters rash started a month ago and has since improved. She believes the rash may be scabies. She denies any associated fever, headache, trouble breathing, throat swelling, or abdominal cramping. No other environmental changes. She did recently travel to [place] and back approximately 1-1/2 month ago. Rash is not painful.  Past Medical History:  Diagnosis Date  . Back pain   . No pertinent past medical history     Patient Active Problem List   Diagnosis Date Noted  . Left flank pain 09/05/2015  . Low back pain 06/25/2014  . LLQ pain 06/25/2014    Past Surgical History:  Procedure Laterality Date  . CESAREAN SECTION    . CESAREAN SECTION  12/06/2011   Procedure: CESAREAN SECTION;  Surgeon: Frederico Hamman, MD;  Location: [facility];  Service: Obstetrics;  Laterality: N/A;  Repeat Cesarean Section with birth of baby A Boy @ 2125, Baby B Girl  @ 2127    OB History    Gravida Para Term Preterm AB Living   6 5 4 1 1 6    SAB TAB Ectopic Multiple Live Births   1 0 0 1 2       Home Medications    Prior to Admission medications   Medication Sig Start Date End Date Taking? Authorizing Provider  acetaminophen (TYLENOL) 500 MG tablet Take 500 mg by mouth every 6 (six) hours as needed. Reported on 03/06/2015    [provider]  guaiFENesin (MUCINEX) 600 MG 12 hr  tablet Take 600 mg by mouth 2 (two) times daily.    [provider]  HYDROcodone-acetaminophen (NORCO) 10-325 MG tablet Take 1 tablet by mouth every 6 (six) hours as needed for severe pain. 09/10/15   Scot Jun, FNP    Family History No family history on file.  Social History Social History  Substance Use Topics  . Smoking status: Never Smoker  . Smokeless tobacco: Never Used  . Alcohol use No     Allergies   Patient has no known allergies.   Review of Systems Review of Systems  Constitutional: Negative for fever.  Skin: Positive for rash.  Neurological: Negative for headaches.     Physical Exam Updated Vital Signs BP 136/87   Pulse 85   Temp 98.1 F (36.7 C) (Oral)   Resp 12   SpO2 99%   Physical Exam  Constitutional: She is oriented to person, place, and time. She appears well-developed and well-nourished. No distress.  HENT:  Head: Atraumatic.  Mouth/Throat: Oropharynx is clear and moist.  Eyes: Conjunctivae are normal.  Neck: Neck supple.  Neurological: She is alert and oriented to person, place, and time.  Skin: Rash (Several small less than 2 mm sporadic papular lesionS noted to bilateral arms, upper chest, lower back, and lower extremity.  Rash not presents on palms of hands, or soles of feet. No oral mucosal involvement) noted.  Psychiatric: She has a normal mood and affect.  Nursing note and vitals reviewed.    ED Treatments / Results  Labs (all labs ordered are listed, but only abnormal results are displayed) Labs Reviewed - No data to display  EKG  EKG Interpretation None       Radiology No results found.  Procedures Procedures (including critical care time)  Medications Ordered in ED Medications - No data to display   Initial Impression / Assessment and Plan / ED Course  I have reviewed the triage vital signs and the nursing notes.  Pertinent labs & imaging results that were available during my care of the patient  were reviewed by me and considered in my medical decision making (see chart for details).     BP 136/87   Pulse 85   Temp 98.1 F (36.7 C) (Oral)   Resp 12   SpO2 99%    Final Clinical Impressions(s) / ED Diagnoses   Final diagnoses:  Rash and nonspecific skin eruption    New Prescriptions New Prescriptions   PERMETHRIN (ELIMITE) 5 % CREAM    Apply from neck to the rest of the body and leave it on for 8 hours before washing it off.  Use it once, may repeat in 1 week if no improvement.   8:49 AM Patient here with itchy rash that appears sporadically throughout her body. Rash is accompanied with excoriation marks but no signs of skin infection. It is nonpustular and nonvesicular. Several family members with similar rash. Although rash does not appears to be in the webspace of her fingers, I would treat her rash for potential scabies with permethrin cream. Encouraged patient to follow-up with dermatologist if no improvement. Rash does not appears to be infectious in etiology. Rash is not suggestive of contact dermatitis.   Domenic Moras, PA-C 11/26/16 5056    Little, Wenda Overland, MD 11/26/16 [phone]

## 2016-11-26 NOTE — ED Notes (Signed)
PA [name] assessed pt in triage, advised this RN pt does not need lab work and he will work on discharge papers.

## 2017-08-05 ENCOUNTER — Ambulatory Visit (HOSPITAL_COMMUNITY)
Admission: EM | Admit: 2017-08-05 | Discharge: 2017-08-05 | Disposition: A | Discharge status: Home / Self Care | Payer: Managed Care, Other (non HMO) | Attending: Family Medicine | Admitting: Family Medicine

## 2017-08-05 ENCOUNTER — Encounter (HOSPITAL_COMMUNITY): Payer: Self-pay | Admitting: Emergency Medicine

## 2017-08-05 DIAGNOSIS — L03012 Cellulitis of left finger: Secondary | ICD-10-CM

## 2017-08-05 DIAGNOSIS — M79645 Pain in left finger(s): Secondary | ICD-10-CM | POA: Diagnosis not present

## 2017-08-05 MED ORDER — CEPHALEXIN 500 MG PO CAPS: 500.0000 mg | ORAL_CAPSULE | Freq: Four times a day (QID) | ORAL | 0 refills | Status: AC

## 2017-08-05 NOTE — ED Provider Notes (Signed)
[facility]    CSN: [id number] Arrival date & time: 08/05/17  1250     History   Chief Complaint Chief Complaint  Patient presents with  . Fingernail Pain    HPI Carol Walls is a 37 y.o. female no significant past medical history presenting today for evaluation of left index finger swelling, redness and pain.  Symptoms began approximately 3 days ago.  She has had worsening pain and swelling.  Denies any injury.  Notes that she previously had similar to left finger and is concerned about the recurrent nature of this.  HPI  Past Medical History:  Diagnosis Date  . Back pain   . No pertinent past medical history     Patient Active Problem List   Diagnosis Date Noted  . Left flank pain 09/05/2015  . Low back pain 06/25/2014  . LLQ pain 06/25/2014    Past Surgical History:  Procedure Laterality Date  . CESAREAN SECTION    . CESAREAN SECTION  12/06/2011   Procedure: CESAREAN SECTION;  Surgeon: Frederico Hamman, MD;  Location: [facility];  Service: Obstetrics;  Laterality: N/A;  Repeat Cesarean Section with birth of baby A Boy @ 2125, Baby B Girl  @ 2127    OB History    Gravida  6   Para  5   Term  4   Preterm  1   AB  1   Living  6     SAB  1   TAB  0   Ectopic  0   Multiple  1   Live Births  2            Home Medications    Prior to Admission medications   Medication Sig Start Date End Date Taking? Authorizing Provider  acetaminophen (TYLENOL) 500 MG tablet Take 500 mg by mouth every 6 (six) hours as needed. Reported on 03/06/2015    [provider]  cephALEXin (KEFLEX) 500 MG capsule Take 1 capsule (500 mg total) by mouth 4 (four) times daily for 7 days. 08/05/17 08/12/17  Pauleen Goleman C, PA-C  guaiFENesin (MUCINEX) 600 MG 12 hr tablet Take 600 mg by mouth 2 (two) times daily.    [provider]  HYDROcodone-acetaminophen (NORCO) 10-325 MG tablet Take 1 tablet by mouth every 6 (six) hours as needed for severe  pain. 09/10/15   Scot Jun, FNP  permethrin (ELIMITE) 5 % cream Apply from neck to the rest of the body and leave it on for 8 hours before washing it off.  Use it once, may repeat in 1 week if no improvement. 11/26/16   Domenic Moras, PA-C  Prenatal Vit-Fe Fumarate-FA (PRENATAL MULTIVITAMIN) TABS Take 1 tablet by mouth daily. 12/08/11 06/18/13  Shelly Bombard, MD    Family History No family history on file.  Social History Social History   Tobacco Use  . Smoking status: Never Smoker  . Smokeless tobacco: Never Used  Substance Use Topics  . Alcohol use: No  . Drug use: No     Allergies   Patient has no known allergies.   Review of Systems Review of Systems  Constitutional: Negative for fatigue and fever.  Eyes: Negative for visual disturbance.  Respiratory: Negative for shortness of breath.   Cardiovascular: Negative for chest pain.  Gastrointestinal: Negative for abdominal pain, nausea and vomiting.  Musculoskeletal: Positive for joint swelling. Negative for arthralgias.  Skin: Positive for color change and wound. Negative for rash.  Neurological:  Negative for dizziness, weakness, light-headedness and headaches.     Physical Exam Triage Vital Signs ED Triage Vitals [08/05/17 1307]  Enc Vitals Group     BP 112/66     Pulse Rate 85     Resp 16     Temp 99 F (37.2 C)     Temp src      SpO2 100 %     Weight      Height      Head Circumference      Peak Flow      Pain Score      Pain Loc      Pain Edu?      Excl. in [place]?    No data found.  Updated Vital Signs BP 112/66   Pulse 85   Temp 99 F (37.2 C)   Resp 16   SpO2 100%   Visual Acuity Right Eye Distance:   Left Eye Distance:   Bilateral Distance:    Right Eye Near:   Left Eye Near:    Bilateral Near:     Physical Exam  Constitutional: She is oriented to person, place, and time. She appears well-developed and well-nourished.  No acute distress  HENT:  Head: Normocephalic and  atraumatic.  Nose: Nose normal.  Eyes: Conjunctivae are normal.  Neck: Neck supple.  Cardiovascular: Normal rate.  Pulmonary/Chest: Effort normal. No respiratory distress.  Abdominal: She exhibits no distension.  Musculoskeletal: Normal range of motion.  Patient has slightly limited range of motion at DIP of left index finger  Neurological: She is alert and oriented to person, place, and time.  Skin: Skin is warm and dry. There is erythema.  Left index finger with swelling and erythema surrounding proximal nail fold as well as lateral nail fold, purulent drainage seen beneath skin of lateral nail fold, mild tenderness to palpation of distal pad of finger  Psychiatric: She has a normal mood and affect.  Nursing note and vitals reviewed.      UC Treatments / Results  Labs (all labs ordered are listed, but only abnormal results are displayed) Labs Reviewed - No data to display  EKG None  Radiology No results found.  Procedures Incision and Drainage Date/Time: 08/05/2017 4:46 PM Performed by: Letoya Stallone, Elesa Hacker, PA-C Authorized by: Raylene Everts, MD   Consent:    Consent obtained:  Verbal   Consent given by:  Patient   Risks discussed:  Incomplete drainage and pain   Alternatives discussed:  No treatment and alternative treatment Location:    Type:  Abscess   Location:  Upper extremity   Upper extremity location:  Finger   Finger location:  L index finger Pre-procedure details:    Procedure prep: alcohol. Anesthesia (see MAR for exact dosages):    Anesthesia method:  Topical application   Topical anesthesia: greuber's pain ease. Procedure type:    Complexity:  Simple Procedure details:    Needle aspiration: no     Incision types:  Stab incision   Incision depth:  Dermal   Scalpel size: 18 g needle.   Drainage:  Purulent and bloody   Drainage amount:  Moderate   Wound treatment:  Wound left open   Packing materials:  None Post-procedure details:    Patient  tolerance of procedure:  Tolerated well, no immediate complications   (including critical care time)  Medications Ordered in UC Medications - No data to display  Initial Impression / Assessment and Plan / UC Course  I have reviewed the triage vital signs and the nursing notes.  Pertinent labs & imaging results that were available during my care of the patient were reviewed by me and considered in my medical decision making (see chart for details).     Patient with paronychia to left index finger, drainage performed with 18-gauge needle.  Will send home with Keflex and advised to do warm compresses/soaks.  Return if not improving, symptoms worsening.  Patient did have tenderness to the distal pulp, this tenderness is more related to the paronychia most likely felon, but discussed with patient advised to continue to monitor and keep a close eye, advised to go to emergency room if tenderness persisting or worsening.Discussed strict return precautions. Patient verbalized understanding and is agreeable with plan.  Final Clinical Impressions(s) / UC Diagnoses   Final diagnoses:  Paronychia of left index finger     Discharge Instructions     Please begin keflex every 6 hours for 1 week  Warm compresses/soaks to finger mulitple times a day with massage for further drainage  Follow up here on in emergency room if swelling worsening, pain to tip of finger worsening or developing difficulty moving finger    ED Prescriptions    Medication Sig Dispense Auth. Provider   cephALEXin (KEFLEX) 500 MG capsule Take 1 capsule (500 mg total) by mouth 4 (four) times daily for 7 days. 28 capsule Winston Misner C, PA-C     Controlled Substance Prescriptions [place] Controlled Substance Registry consulted? Not Applicable   Janith Lima, [place] 08/05/17 1649

## 2017-08-05 NOTE — Discharge Instructions (Signed)
Please begin keflex every 6 hours for 1 week  Warm compresses/soaks to finger mulitple times a day with massage for further drainage  Follow up here on in emergency room if swelling worsening, pain to tip of finger worsening or developing difficulty moving finger

## 2017-08-05 NOTE — ED Triage Notes (Signed)
Pt c/o pain and swelling to L pointer fingernail. x3 days

## 2018-05-27 ENCOUNTER — Emergency Department (HOSPITAL_COMMUNITY)
Admission: EM | Admit: 2018-05-27 | Discharge: 2018-05-27 | Disposition: A | Discharge status: Home / Self Care | Payer: Managed Care, Other (non HMO) | Attending: Emergency Medicine | Admitting: Emergency Medicine

## 2018-05-27 ENCOUNTER — Other Ambulatory Visit: Payer: Self-pay

## 2018-05-27 ENCOUNTER — Encounter (HOSPITAL_COMMUNITY): Payer: Self-pay | Admitting: Student

## 2018-05-27 ENCOUNTER — Emergency Department (HOSPITAL_COMMUNITY): Payer: Managed Care, Other (non HMO)

## 2018-05-27 DIAGNOSIS — R079 Chest pain, unspecified: Secondary | ICD-10-CM | POA: Diagnosis present

## 2018-05-27 LAB — COMPREHENSIVE METABOLIC PANEL
ALT: 17 U/L (ref 0–44)
AST: 17 U/L (ref 15–41)
Albumin: 3.8 g/dL (ref 3.5–5.0)
Alkaline Phosphatase: 43 U/L (ref 38–126)
Anion gap: 11 (ref 5–15)
BUN: 15 mg/dL (ref 6–20)
CO2: 21 mmol/L — ABNORMAL LOW (ref 22–32)
Calcium: 9.6 mg/dL (ref 8.9–10.3)
Chloride: 106 mmol/L (ref 98–111)
Creatinine, Ser: 0.67 mg/dL (ref 0.44–1.00)
GFR calc Af Amer: >60 mL/min (ref >60)
GFR calc non Af Amer: >60 mL/min (ref >60)
Glucose, Bld: 107 mg/dL — ABNORMAL HIGH (ref 70–99)
Potassium: 3.8 mmol/L (ref 3.5–5.1)
Sodium: 138 mmol/L (ref 135–145)
Total Bilirubin: 0.7 mg/dL (ref 0.3–1.2)
Total Protein: 7.2 g/dL (ref 6.5–8.1)

## 2018-05-27 LAB — CBC WITH DIFFERENTIAL/PLATELET
Abs Immature Granulocytes: 0 10*3/uL (ref 0.00–0.07)
Basophils Absolute: 0 10*3/uL (ref 0.0–0.1)
Basophils Relative: 1 %
Eosinophils Absolute: 0.1 10*3/uL (ref 0.0–0.5)
Eosinophils Relative: 4 %
HCT: 37.4 % (ref 36.0–46.0)
Hemoglobin: 13.1 g/dL (ref 12.0–15.0)
Immature Granulocytes: 0 %
Lymphocytes Relative: 40 %
Lymphs Abs: 1.3 10*3/uL (ref 0.7–4.0)
MCH: 29.6 pg (ref 26.0–34.0)
MCHC: 35 g/dL (ref 30.0–36.0)
MCV: 84.4 fL (ref 80.0–100.0)
Monocytes Absolute: 0.4 10*3/uL (ref 0.1–1.0)
Monocytes Relative: 14 %
Neutro Abs: 1.3 10*3/uL — ABNORMAL LOW (ref 1.7–7.7)
Neutrophils Relative %: 41 %
Platelets: 171 10*3/uL (ref 150–400)
RBC: 4.43 MIL/uL (ref 3.87–5.11)
RDW: 11.8 % (ref 11.5–15.5)
WBC: 3.1 10*3/uL — ABNORMAL LOW (ref 4.0–10.5)
nRBC: 0 % (ref 0.0–0.2)

## 2018-05-27 LAB — TROPONIN I
Troponin I: <0.03 ng/mL (ref <0.03)
Troponin I: <0.03 ng/mL (ref <0.03)

## 2018-05-27 LAB — URINALYSIS, ROUTINE W REFLEX MICROSCOPIC
Bacteria, UA: NONE SEEN
Bilirubin Urine: NEGATIVE
Glucose, UA: NEGATIVE mg/dL
Ketones, ur: NEGATIVE mg/dL
Leukocytes,Ua: NEGATIVE
Nitrite: NEGATIVE
Protein, ur: NEGATIVE mg/dL
Specific Gravity, Urine: 1.029 (ref 1.005–1.030)
pH: 5 (ref 5.0–8.0)

## 2018-05-27 LAB — I-STAT BETA HCG BLOOD, ED (MC, WL, AP ONLY): I-stat hCG, quantitative: <5 m[IU]/mL (ref <5)

## 2018-05-27 LAB — LIPASE, BLOOD: Lipase: 25 U/L (ref 11–51)

## 2018-05-27 MED ORDER — SODIUM CHLORIDE 0.9 % IV BOLUS
500.0000 mL | Freq: Once | INTRAVENOUS | Status: AC
  Administered 2018-05-27: 500 mL via INTRAVENOUS

## 2018-05-27 MED ORDER — ONDANSETRON HCL 4 MG/2ML IJ SOLN
4.0000 mg | Freq: Once | INTRAMUSCULAR | Status: AC
  Administered 2018-05-27: 4 mg via INTRAVENOUS
  Filled 2018-05-27: qty 2

## 2018-05-27 MED ORDER — MORPHINE SULFATE (PF) 4 MG/ML IV SOLN
4.0000 mg | Freq: Once | INTRAVENOUS | Status: AC
  Administered 2018-05-27: 4 mg via INTRAVENOUS
  Filled 2018-05-27: qty 1

## 2018-05-27 NOTE — ED Provider Notes (Signed)
[facility] EMERGENCY DEPARTMENT Provider Note   CSN: [id number] Arrival date & time: 05/27/18  1412   History   Chief Complaint Chief Complaint  Patient presents with  . Chest Pain    HPI Carol Walls is a 38 y.o. female without significant past medical hx who presents to the ED with complaints of chest pain that began yesterday. Patient states that she was kneeling praying when she developed pain to the L chest shooting to the LUE with associated dyspnea. She states episode was so painful she felt she could not move and fell back, did not pass out, feeling lightheaded/dizzy, have head injury, or LOC. She states severe pain lasted for about 20 minutes then seemed to ease off. Still having persistent pain to L chest & epigastrium. Seems to be worse with movement, deep breath, and palpation, no alleviating factors. No meds PTA. Hx of similar episodes x 2 within past 1.5 years, not evaluated in the past. Denies nausea, vomiting, diaphoresis, diarrhea, melena, hematochezia, numbness, focal weakness, dizziness, lightheadedness, syncope, leg pain/swelling, hemoptysis, recent surgery/trauma, recent long travel, hormone use, personal hx of cancer, or hx of DVT/PE. Does have family hx of cardiomyopathy with death in multiple family members.   Investment banker, operational throughout encounter.      HPI  Past Medical History:  Diagnosis Date  . Back pain   . No pertinent past medical history     Patient Active Problem List   Diagnosis Date Noted  . Left flank pain 09/05/2015  . Low back pain 06/25/2014  . LLQ pain 06/25/2014    Past Surgical History:  Procedure Laterality Date  . CESAREAN SECTION    . CESAREAN SECTION  12/06/2011   Procedure: CESAREAN SECTION;  Surgeon: Frederico Hamman, MD;  Location: [facility];  Service: Obstetrics;  Laterality: N/A;  Repeat Cesarean Section with birth of baby A Boy @ 2125, Baby B Girl  @ 2127     OB History    Gravida  6   Para   5   Term  4   Preterm  1   AB  1   Living  6     SAB  1   TAB  0   Ectopic  0   Multiple  1   Live Births  2            Home Medications    Prior to Admission medications   Medication Sig Start Date End Date Taking? Authorizing Provider  acetaminophen (TYLENOL) 500 MG tablet Take 500 mg by mouth every 6 (six) hours as needed. Reported on 03/06/2015    [provider]  guaiFENesin (MUCINEX) 600 MG 12 hr tablet Take 600 mg by mouth 2 (two) times daily.    [provider]  HYDROcodone-acetaminophen (NORCO) 10-325 MG tablet Take 1 tablet by mouth every 6 (six) hours as needed for severe pain. 09/10/15   Scot Jun, FNP  permethrin (ELIMITE) 5 % cream Apply from neck to the rest of the body and leave it on for 8 hours before washing it off.  Use it once, may repeat in 1 week if no improvement. 11/26/16   Domenic Moras, PA-C  Prenatal Vit-Fe Fumarate-FA (PRENATAL MULTIVITAMIN) TABS Take 1 tablet by mouth daily. 12/08/11 06/18/13  Shelly Bombard, MD    Family History No family history on file.  Social History Social History   Tobacco Use  . Smoking status: Never Smoker  . Smokeless tobacco:  Never Used  Substance Use Topics  . Alcohol use: No  . Drug use: No     Allergies   Patient has no known allergies.   Review of Systems Review of Systems  Constitutional: Negative for chills, diaphoresis and fever.  HENT: Negative for congestion, ear pain and sore throat.   Respiratory: Positive for shortness of breath. Negative for cough.   Cardiovascular: Positive for chest pain. Negative for palpitations and leg swelling.  Gastrointestinal: Positive for abdominal pain. Negative for blood in stool, constipation, diarrhea, nausea and vomiting.  Genitourinary: Negative for dysuria.  Neurological: Negative for dizziness, tremors, seizures, syncope, facial asymmetry, speech difficulty, weakness, light-headedness, numbness and headaches.  All other  systems reviewed and are negative.    Physical Exam Updated Vital Signs BP 119/84 (BP Location: Right Arm)   Pulse 92   Temp 98.5 F (36.9 C) (Oral)   Resp 16   SpO2 100%   Physical Exam Vitals signs and nursing note reviewed.  Constitutional:      General: She is not in acute distress.    Appearance: She is well-developed. She is not toxic-appearing.  HENT:     Head: Normocephalic and atraumatic.  Eyes:     General:        Right eye: No discharge.        Left eye: No discharge.     Conjunctiva/sclera: Conjunctivae normal.  Neck:     Musculoskeletal: Neck supple.  Cardiovascular:     Rate and Rhythm: Normal rate and regular rhythm.     Pulses:          Radial pulses are 2+ on the right side and 2+ on the left side.       Dorsalis pedis pulses are 2+ on the right side and 2+ on the left side.  Pulmonary:     Effort: Pulmonary effort is normal. No respiratory distress.     Breath sounds: Normal breath sounds. No wheezing, rhonchi or rales.  Chest:     Chest wall: Tenderness (sternum and left anterior chest wall without overlying skin changes or palpable crepitus) present. No deformity or crepitus.  Abdominal:     General: There is no distension.     Palpations: Abdomen is soft.     Tenderness: There is no abdominal tenderness. There is no guarding or rebound. Negative signs include Murphy's sign and McBurney's sign.  Musculoskeletal:     Right lower leg: She exhibits no tenderness. No edema.     Left lower leg: She exhibits no tenderness. No edema.  Skin:    General: Skin is warm and dry.     Capillary Refill: Capillary refill takes less than 2 seconds.     Findings: No rash.  Neurological:     General: No focal deficit present.     Mental Status: She is alert.     Comments: Alert. Clear speech. No facial droop. CNIII-XII grossly intact. Bilateral upper and lower extremities' sensation grossly intact. 5/5 symmetric strength with grip strength and with plantar and  dorsi flexion bilaterally . Normal finger to nose bilaterally. Negative pronator drift. Gait is steady and intact.   Psychiatric:        Behavior: Behavior normal.    ED Treatments / Results  Labs (all labs ordered are listed, but only abnormal results are displayed) Labs Reviewed  CBC WITH DIFFERENTIAL/PLATELET - Abnormal; Notable for the following components:      Result Value   WBC 3.1 (*)  Neutro Abs 1.3 (*)    All other components within normal limits  COMPREHENSIVE METABOLIC PANEL - Abnormal; Notable for the following components:   CO2 21 (*)    Glucose, Bld 107 (*)    All other components within normal limits  URINALYSIS, ROUTINE W REFLEX MICROSCOPIC - Abnormal; Notable for the following components:   APPearance HAZY (*)    Hgb urine dipstick SMALL (*)    All other components within normal limits  LIPASE, BLOOD  TROPONIN I  TROPONIN I  I-STAT BETA HCG BLOOD, ED ([facility], [facility], [facility] ONLY)    EKG EKG Interpretation  Date/Time:  Tuesday May 27 2018 15:44:22 EDT Ventricular Rate:  80 PR Interval:  164 QRS Duration: 80 QT Interval:  362 QTC Calculation: 418 R Axis:   40 Text Interpretation:  Sinus rhythm Consider left atrial enlargement Confirmed by Dene Gentry [phone]) on 05/27/2018 4:09:14 PM   Radiology Dg Chest 2 View  Result Date: 05/27/2018 CLINICAL DATA:  Patient chest pain, left-sided. EXAM: CHEST - 2 VIEW COMPARISON:  None. FINDINGS: The heart size and mediastinal contours are within normal limits. Both lungs are clear. The visualized skeletal structures are unremarkable. IMPRESSION: No active cardiopulmonary disease. Electronically Signed   By: Lovey Newcomer M.D.   On: 05/27/2018 16:47    Procedures Procedures (including critical care time)  Medications Ordered in ED Medications - No data to display   Initial Impression / Assessment and Plan / ED Course  I have reviewed the triage vital signs and the nursing notes.  Pertinent labs & imaging results that  were available during my care of the patient were reviewed by me and considered in my medical decision making (see chart for details).    Patient presents to the emergency department with chest pain. Patient nontoxic appearing, in no apparent distress, initial vitals without significant abnormality. Fairly benign physical exam. DDX: ACS, pulmonary embolism, dissection, pneumothorax, effusion, infiltrate, arrhythmia, anemia, electrolyte derangement, MSK, GERD, pancreatitis, cholecystitis, anxiety. Evaluation initiated with labs, EKG, and CXR. Patient on cardiac monitor.   Work-up in the ER reviewed:  CBC: Mild leukopenia consistent with prior. No anemia.  CMP: No significant electrolyte disturbance. LFTs/renal function preserved.  Lipase: WNL. UA: No obvious infection.  Troponin: delta negative EKG: No STEMI.  CXR:  Negative, without infiltrate, effusion, pneumothorax, or fracture/dislocation.   Non-surgical abdomen. No neuro deficits. Low risk heart score, EKG without obvious ischemia, delta troponin negative, doubt ACS. Patient is low risk wells, PERC negative, doubt pulmonary embolism. Pain is not a tearing sensation, symmetric pulses, no widening of mediastinum on CXR, doubt dissection. Cardiac monitor reviewed, no notable arrhythmias or tachycardia. Patient does have family hx of cardiomyopathy w/ death- exercise restriction, close cardiology follow up.  Patient has appeared hemodynamically stable throughout ER visit and appears safe for discharge with close cardiology follow up. I discussed results, treatment plan, need for follow-up, and return precautions with the patient & her brother via telephone @ her request. Provided opportunity for questions, patient & her mother confirmed understanding and are in agreement with plan.   Findings and plan of care discussed with supervising physician Dr. Francia Greaves who is in agreement.   Final Clinical Impressions(s) / ED Diagnoses   Final diagnoses:   Chest pain, unspecified type    ED Discharge Orders    None       Leafy Kindle 05/27/18 2112    Valarie Merino, MD 05/27/18 2332

## 2018-05-27 NOTE — ED Notes (Signed)
Patient transported to X-ray 

## 2018-05-27 NOTE — ED Triage Notes (Signed)
Pt in with L side cp radiates to L shoulder, started last night. Worse with deep breaths, denies fevers, sob or cough

## 2018-05-27 NOTE — ED Notes (Signed)
Pt's sps. Jay Schlichter [phone]. Pls contact for status update and D/C.

## 2018-05-27 NOTE — Discharge Instructions (Addendum)
You were seen in the emergency department today for chest pain. Your work-up in the emergency department has been overall reassuring. Your labs have been fairly normal and or similar to previous blood work you have had done. Your EKG and the enzyme we use to check your heart did not show an acute heart attack at this time. Your chest x-ray was normal.   We would like you to follow up closely with your primary care provider and/or the cardiologist provided in your discharge instructions within 1-3 days. Please do not overly exert yourself or exercise in the meantime. Return to the ER immediately should you experience any new or worsening symptoms including but not limited to return of pain, worsened pain, vomiting, shortness of breath, dizziness, lightheadedness, passing out, or any other concerns that you may have.

## 2018-05-27 NOTE — ED Notes (Signed)
Patient verbalizes understanding of medications and discharge instructions. No further questions at this time. VSS and patient ambulatory at discharge.   

## 2018-12-24 ENCOUNTER — Other Ambulatory Visit: Payer: Self-pay

## 2019-05-04 ENCOUNTER — Ambulatory Visit (INDEPENDENT_AMBULATORY_CARE_PROVIDER_SITE_OTHER): Payer: Medicaid Other | Admitting: Obstetrics & Gynecology

## 2019-05-04 ENCOUNTER — Encounter: Payer: Self-pay | Admitting: Obstetrics & Gynecology

## 2019-05-04 ENCOUNTER — Other Ambulatory Visit (HOSPITAL_COMMUNITY)
Admission: RE | Admit: 2019-05-04 | Discharge: 2019-05-04 | Disposition: A | Discharge status: Home / Self Care | Payer: 59 | Source: Ambulatory Visit | Attending: Obstetrics & Gynecology | Admitting: Obstetrics & Gynecology

## 2019-05-04 ENCOUNTER — Other Ambulatory Visit: Payer: Self-pay

## 2019-05-04 VITALS — BP 118/78 | HR 91 | Ht 66.0 in | Wt 174.0 lb

## 2019-05-04 DIAGNOSIS — Z30432 Encounter for removal of intrauterine contraceptive device: Secondary | ICD-10-CM

## 2019-05-04 DIAGNOSIS — Z98891 History of uterine scar from previous surgery: Secondary | ICD-10-CM

## 2019-05-04 DIAGNOSIS — Z1151 Encounter for screening for human papillomavirus (HPV): Secondary | ICD-10-CM

## 2019-05-04 DIAGNOSIS — M549 Dorsalgia, unspecified: Secondary | ICD-10-CM | POA: Diagnosis not present

## 2019-05-04 DIAGNOSIS — Z30431 Encounter for routine checking of intrauterine contraceptive device: Secondary | ICD-10-CM

## 2019-05-04 DIAGNOSIS — Z603 Acculturation difficulty: Secondary | ICD-10-CM | POA: Diagnosis not present

## 2019-05-04 DIAGNOSIS — Z01419 Encounter for gynecological examination (general) (routine) without abnormal findings: Secondary | ICD-10-CM

## 2019-05-04 NOTE — Progress Notes (Signed)
GYNECOLOGY ANNUAL PREVENTATIVE CARE ENCOUNTER NOTE  History:     Carol Walls is a 39 y.o. [phone] female here for a routine annual gynecologic exam. Patient is  Arabic-speaking only, interpreter present for this encounter.  Current complaints: has chronic back pain, desires Paragard IUD removal that has been in place for seven years. Feels it is caused by IUD.Marland Kitchen   Denies abnormal vaginal bleeding, discharge, pelvic pain, problems with intercourse or other gynecologic concerns.    Gynecologic History Patient's last menstrual period was 04/13/2019 (exact date). Contraception: IUD Last Pap: 2015. Results were: normal with negative HPV  Obstetric History OB History  Gravida Para Term Preterm AB Living  6 5 4 1 1 6   SAB TAB Ectopic Multiple Live Births  1 0 0 1 2    # Outcome Date GA Lbr Len/2nd Weight Sex Delivery Anes PTL Lv  6A Preterm 12/06/11 [redacted]w[redacted]d  4 lb 11.5 oz (2.14 kg) M CS-LVertical Spinal  LIV  6B Preterm 12/06/11 [redacted]w[redacted]d  5 lb 5.7 oz (2.43 kg) F CS-LVertical Spinal  LIV  5 Term 2010    F CS-Unspec     4 SAB 2009 [redacted]w[redacted]d         3 Term 2005    M Vag-Spont     2 Term 2002    M Vag-Spont     1 Term 1999    M Vag-Spont       Past Medical History:  Diagnosis Date  . Back pain   . No pertinent past medical history     Past Surgical History:  Procedure Laterality Date  . CESAREAN SECTION    . CESAREAN SECTION  12/06/2011   Procedure: CESAREAN SECTION;  Surgeon: Frederico Hamman, MD;  Location: [facility];  Service: Obstetrics;  Laterality: N/A;  Repeat Cesarean Section with birth of baby A Boy @ 2125, Baby B Girl  @ 2127    Current Outpatient Medications on File Prior to Visit  Medication Sig Dispense Refill  . levonorgestrel (MIRENA) 20 MCG/24HR IUD 1 each by Intrauterine route once.     No current facility-administered medications on file prior to visit.    No Known Allergies  Social History:  reports that she has never smoked. She has never used smokeless tobacco.  She reports that she does not drink alcohol or use drugs.  History reviewed. No pertinent family history.  The following portions of the patient's history were reviewed and updated as appropriate: allergies, current medications, past family history, past medical history, past social history, past surgical history and problem list.  Review of Systems Pertinent items noted in HPI and remainder of comprehensive ROS otherwise negative.  Physical Exam:  BP 118/78   Pulse 91   Ht 5\' 6"  (1.676 m)   Wt 174 lb (78.9 kg)   LMP 04/13/2019 (Exact Date)   BMI 28.08 kg/m  CONSTITUTIONAL: Well-developed, well-nourished female in no acute distress.  HENT:  Normocephalic, atraumatic, External right and left ear normal. Oropharynx is clear and moist EYES: Conjunctivae and EOM are normal. Pupils are equal, round, and reactive to light. No scleral icterus.  NECK: Normal range of motion, supple, no masses.  Normal thyroid.  SKIN: Skin is warm and dry. No rash noted. Not diaphoretic. No erythema. No pallor. MUSCULOSKELETAL: Normal range of motion. No tenderness.  No cyanosis, clubbing, or edema.  2+ distal pulses. NEUROLOGIC: Alert and oriented to person, place, and time. Normal reflexes, muscle tone coordination.  PSYCHIATRIC: Normal mood  and affect. Normal behavior. Normal judgment and thought content. CARDIOVASCULAR: Normal heart rate noted, regular rhythm RESPIRATORY: Clear to auscultation bilaterally. Effort and breath sounds normal, no problems with respiration noted. BREASTS: Symmetric in size. No masses, tenderness, skin changes, nipple drainage, or lymphadenopathy bilaterally. Performed in the presence of a chaperone. ABDOMEN: Soft, no distention noted.  No tenderness, rebound or guarding.  PELVIC: Normal appearing external genitalia and urethral meatus; normal appearing vaginal mucosa and cervix.  No abnormal discharge noted.  Pap smear obtained.  Normal uterine size, no other palpable masses, no  uterine or adnexal tenderness.  Performed in the presence of a chaperone.  IUD Removal  Patient identified, informed consent performed, consent signed.  Patient was in the dorsal lithotomy position, normal external genitalia was noted.  A speculum was placed in the patient's vagina, normal discharge was noted, no lesions. The cervix was visualized, no lesions, no abnormal discharge.  The strings of the IUD were grasped and pulled using ring forceps. The IUD was removed in its entirety.   Patient tolerated the procedure well.     Assessment and Plan:      1. Family planning, IUD (intrauterine device) check/reinsertion/removal IUD removed, patient cautioned that her back pain could still continue which would mean the IUD was not the cause. This was discussed prior to removal but she insisted on removal. Will use condoms for contraception.  2. Well woman exam with routine gynecological exam Will follow up results of pap smear and manage accordingly. Mammogram scholarship application given to patient. Routine preventative health maintenance measures emphasized. Please refer to After Visit Summary for other counseling recommendations.      Verita Schneiders, MD, [place] for [employer], [place]

## 2019-05-04 NOTE — Patient Instructions (Signed)

## 2019-05-04 NOTE — Progress Notes (Signed)
[name] [phone] Presents for AEX/PAP.  C/o back pain 9/10, she wants to remove IUD

## 2019-05-06 LAB — CYTOLOGY - PAP
Comment: NEGATIVE
Diagnosis: NEGATIVE
High risk HPV: NEGATIVE

## 2019-05-07 ENCOUNTER — Other Ambulatory Visit: Payer: Self-pay

## 2019-05-07 ENCOUNTER — Ambulatory Visit (INDEPENDENT_AMBULATORY_CARE_PROVIDER_SITE_OTHER): Payer: 59

## 2019-05-07 ENCOUNTER — Ambulatory Visit (HOSPITAL_COMMUNITY)
Admission: EM | Admit: 2019-05-07 | Discharge: 2019-05-07 | Disposition: A | Discharge status: Home / Self Care | Payer: 59 | Attending: Internal Medicine | Admitting: Internal Medicine

## 2019-05-07 ENCOUNTER — Encounter (HOSPITAL_COMMUNITY): Payer: Self-pay

## 2019-05-07 DIAGNOSIS — M5432 Sciatica, left side: Secondary | ICD-10-CM | POA: Diagnosis not present

## 2019-05-07 DIAGNOSIS — M5431 Sciatica, right side: Secondary | ICD-10-CM

## 2019-05-07 DIAGNOSIS — M545 Low back pain: Secondary | ICD-10-CM

## 2019-05-07 DIAGNOSIS — K5900 Constipation, unspecified: Secondary | ICD-10-CM

## 2019-05-07 MED ORDER — SENNOSIDES-DOCUSATE SODIUM 8.6-50 MG PO TABS: 1.0000 | ORAL_TABLET | Freq: Two times a day (BID) | ORAL | 0 refills | Status: AC

## 2019-05-07 MED ORDER — KETOROLAC TROMETHAMINE 30 MG/ML IJ SOLN
30.0000 mg | Freq: Once | INTRAMUSCULAR | Status: AC
  Administered 2019-05-07: 30 mg via INTRAMUSCULAR

## 2019-05-07 MED ORDER — PREDNISONE 10 MG PO TABS: 20.0000 mg | ORAL_TABLET | Freq: Every day | ORAL | 0 refills | Status: AC

## 2019-05-07 MED ORDER — IBUPROFEN 600 MG PO TABS: 600.0000 mg | ORAL_TABLET | Freq: Four times a day (QID) | ORAL | 0 refills | Status: DC | PRN

## 2019-05-07 MED ORDER — POLYETHYLENE GLYCOL 3350 17 G PO PACK: 17.0000 g | PACK | Freq: Every day | ORAL | 0 refills | Status: DC | PRN

## 2019-05-07 MED ORDER — KETOROLAC TROMETHAMINE 30 MG/ML IJ SOLN
INTRAMUSCULAR | Status: AC
  Filled 2019-05-07: qty 1

## 2019-05-07 NOTE — ED Triage Notes (Signed)
Pt c/o right ankle/foot pain for approx 1 week. Right lateral ankle area slightly swollen. Denies injury/trauma to area. Also c/o recurrent/ongoing pain to left buttocks, left groin and abdomen and left leg/foot.

## 2019-05-07 NOTE — ED Provider Notes (Signed)
[facility]    CSN: [id number] Arrival date & time: 05/07/19  1530      History   Chief Complaint Chief Complaint  Patient presents with  . Foot Pain  . Abdominal Pain    HPI Carol Walls is a 39 y.o. female comes to urgent care with complaints of 1 week history of low back pain.  Pain is of moderate severity and currently at 8 out of 10.  Last night the patient could not sleep because of the back pain.  According to the patient the pain is sharp and radiates to both lower extremities.  She denies any relieving factors.  She took some over-the-counter medications yesterday with no improvement.  No weakness in the lower extremities.  She has some numbness in the feet but no weakness in the lower extremities.  No trauma to the back.Marland Kitchen   HPI  Past Medical History:  Diagnosis Date  . Back pain   . No pertinent past medical history     Patient Active Problem List   Diagnosis Date Noted  . Left flank pain 09/05/2015  . Low back pain 06/25/2014  . LLQ pain 06/25/2014    Past Surgical History:  Procedure Laterality Date  . CESAREAN SECTION    . CESAREAN SECTION  12/06/2011   Procedure: CESAREAN SECTION;  Surgeon: Frederico Hamman, MD;  Location: [facility];  Service: Obstetrics;  Laterality: N/A;  Repeat Cesarean Section with birth of baby A Boy @ 2125, Baby B Girl  @ 2127    OB History    Gravida  6   Para  5   Term  4   Preterm  1   AB  1   Living  6     SAB  1   TAB  0   Ectopic  0   Multiple  1   Live Births  2            Home Medications    Prior to Admission medications   Medication Sig Start Date End Date Taking? Authorizing Provider  levonorgestrel (MIRENA) 20 MCG/24HR IUD 1 each by Intrauterine route once.    [provider]    Family History History reviewed. No pertinent family history.  Social History Social History   Tobacco Use  . Smoking status: Never Smoker  . Smokeless tobacco: Never Used  Substance  Use Topics  . Alcohol use: No  . Drug use: No     Allergies   Patient has no known allergies.   Review of Systems Review of Systems  Respiratory: Negative for chest tightness and shortness of breath.   Gastrointestinal: Negative for constipation, nausea and vomiting.  Genitourinary: Negative for dysuria, frequency and urgency.  Musculoskeletal: Positive for arthralgias and back pain. Negative for joint swelling, myalgias and neck pain.  Skin: Negative for pallor and rash.  Neurological: Negative for dizziness, light-headedness and headaches.     Physical Exam Triage Vital Signs ED Triage Vitals  Enc Vitals Group     BP 05/07/19 1553 122/79     Pulse Rate 05/07/19 1553 82     Resp 05/07/19 1553 16     Temp 05/07/19 1553 98.4 F (36.9 C)     Temp Source 05/07/19 1553 Oral     SpO2 05/07/19 1553 98 %     Weight --      Height --      Head Circumference --      Peak Flow --  Pain Score 05/07/19 1602 7     Pain Loc --      Pain Edu? --      Excl. in [place]? --    No data found.  Updated Vital Signs BP 122/79 (BP Location: Left Arm)   Pulse 82   Temp 98.4 F (36.9 C) (Oral)   Resp 16   LMP 04/13/2019 (Exact Date)   SpO2 98%   Visual Acuity Right Eye Distance:   Left Eye Distance:   Bilateral Distance:    Right Eye Near:   Left Eye Near:    Bilateral Near:     Physical Exam Vitals and nursing note reviewed.  Constitutional:      General: She is in acute distress.     Appearance: She is not ill-appearing.  Eyes:     Extraocular Movements: Extraocular movements intact.  Cardiovascular:     Rate and Rhythm: Normal rate and regular rhythm.     Heart sounds: Normal heart sounds. No murmur. No friction rub.  Pulmonary:     Effort: Pulmonary effort is normal. No respiratory distress.     Breath sounds: Normal breath sounds. No wheezing or rhonchi.  Abdominal:     General: Abdomen is flat. Bowel sounds are normal. There is no distension or abdominal bruit.       Palpations: Abdomen is rigid. There is no shifting dullness or fluid wave.     Tenderness: There is no abdominal tenderness.     Hernia: No hernia is present.  Skin:    Capillary Refill: Capillary refill takes less than 2 seconds.  Neurological:     General: No focal deficit present.     Mental Status: She is alert.      UC Treatments / Results  Labs (all labs ordered are listed, but only abnormal results are displayed) Labs Reviewed - No data to display  EKG   Radiology No results found.  Procedures Procedures (including critical care time)  Medications Ordered in UC Medications - No data to display  Initial Impression / Assessment and Plan / UC Course  I have reviewed the triage vital signs and the nursing notes.  Pertinent labs & imaging results that were available during my care of the patient were reviewed by me and considered in my medical decision making (see chart for details).     1.  Sciatica: X-ray of the lumbar spine was independently reviewed by me.  No acute fractures.  Patient appears to have moderate stool burden. Toradol 30 mg IM x1 dose Senokot 1 tablet twice daily MiraLAX as needed Ibuprofen 600 mg every 6 hours as needed Prednisone 20 milligrams orally daily for 3 days Gentle range of motion exercises If patient develops any worsening symptoms she is advised to return to urgent care to be reevaluated. Final Clinical Impressions(s) / UC Diagnoses   Final diagnoses:  None   Discharge Instructions   None    ED Prescriptions    None     PDMP not reviewed this encounter.   Chase Picket, MD 05/07/19 [phone]

## 2019-08-10 ENCOUNTER — Ambulatory Visit (INDEPENDENT_AMBULATORY_CARE_PROVIDER_SITE_OTHER): Payer: 59 | Admitting: Obstetrics and Gynecology

## 2019-08-10 ENCOUNTER — Other Ambulatory Visit: Payer: Self-pay

## 2019-08-10 ENCOUNTER — Ambulatory Visit: Payer: 59 | Admitting: Obstetrics and Gynecology

## 2019-08-10 ENCOUNTER — Encounter: Payer: Self-pay | Admitting: Obstetrics and Gynecology

## 2019-08-10 VITALS — BP 135/86 | HR 91 | Wt 175.8 lb

## 2019-08-10 DIAGNOSIS — Z3202 Encounter for pregnancy test, result negative: Secondary | ICD-10-CM

## 2019-08-10 DIAGNOSIS — N926 Irregular menstruation, unspecified: Secondary | ICD-10-CM | POA: Diagnosis not present

## 2019-08-10 LAB — POCT URINE PREGNANCY: Preg Test, Ur: NEGATIVE

## 2019-08-10 MED ORDER — NORGESTIMATE-ETH ESTRADIOL 0.25-35 MG-MCG PO TABS: 1.0000 | ORAL_TABLET | Freq: Every day | ORAL | 4 refills | Status: DC

## 2019-08-10 NOTE — Progress Notes (Signed)
39 yo P6 here for the evaluation of amenorrhea. Patient reports LMP 5/16 and did not have a period this month thus far. Patient is sexually active using condoms. She is interested in starting birth control pills. Patient reports a negative pregnancy test at home. She denies pelvic pain or abnormal discharge.   Past Medical History:  Diagnosis Date  . Back pain   . No pertinent past medical history    Past Surgical History:  Procedure Laterality Date  . CESAREAN SECTION    . CESAREAN SECTION  12/06/2011   Procedure: CESAREAN SECTION;  Surgeon: Frederico Hamman, MD;  Location: [facility];  Service: Obstetrics;  Laterality: N/A;  Repeat Cesarean Section with birth of baby A Boy @ 2125, Baby B Girl  @ 2127   No family history on file. Social History   Tobacco Use  . Smoking status: Never Smoker  . Smokeless tobacco: Never Used  Substance Use Topics  . Alcohol use: No  . Drug use: No   ROS See pertinent in HPI. All other systems reviewed and negative  Blood pressure 135/86, pulse 91, weight 175 lb 12.8 oz (79.7 kg), currently breastfeeding.  GENERAL: Well-developed, well-nourished female in no acute distress.  ABDOMEN: Soft, nontender, nondistended. No organomegaly. PELVIC: Normal external female genitalia. Vagina is pink and rugated.  Normal discharge. Normal appearing cervix. Uterus is normal in size. No adnexal mass or tenderness. EXTREMITIES: No cyanosis, clubbing, or edema, 2+ distal pulses.  A/P 39 yo with a one time episode of amenorrhea  - reassurance provided - quant HCG today to ease patient anxiety regarding possible pregnancy - Patient desires birth control pills- Rx sprintec provided - RTC prn

## 2019-08-11 LAB — BETA HCG QUANT (REF LAB): hCG Quant: <1 m[IU]/mL

## 2019-08-13 ENCOUNTER — Telehealth: Payer: Self-pay

## 2019-08-13 NOTE — Telephone Encounter (Signed)
Called pt again and was able to to let pt know to start contraception and that results were negative.

## 2019-10-26 ENCOUNTER — Ambulatory Visit (HOSPITAL_COMMUNITY)
Admission: EM | Admit: 2019-10-26 | Discharge: 2019-10-26 | Disposition: A | Discharge status: Home / Self Care | Payer: 59 | Attending: Physician Assistant | Admitting: Physician Assistant

## 2019-10-26 ENCOUNTER — Other Ambulatory Visit: Payer: Self-pay

## 2019-10-26 ENCOUNTER — Encounter (HOSPITAL_COMMUNITY): Payer: Self-pay

## 2019-10-26 DIAGNOSIS — H6123 Impacted cerumen, bilateral: Secondary | ICD-10-CM | POA: Diagnosis not present

## 2019-10-26 NOTE — ED Provider Notes (Signed)
[place]    CSN: [id number] Arrival date & time: 10/26/19  1247      History   Chief Complaint Chief Complaint  Patient presents with  . Otalgia    HPI Carol Walls is a 39 y.o. female.   Pt presents with 1 week of left ear pain.  Denies radiation of pain.  Denies fever, chills, congestion, sore throat, rhinorrhea.  She has tried nothing for the sx.  Reports "muffled" hearing to left ear.       Past Medical History:  Diagnosis Date  . Back pain   . No pertinent past medical history     Patient Active Problem List   Diagnosis Date Noted  . Left flank pain 09/05/2015  . Low back pain 06/25/2014  . LLQ pain 06/25/2014    Past Surgical History:  Procedure Laterality Date  . CESAREAN SECTION    . CESAREAN SECTION  12/06/2011   Procedure: CESAREAN SECTION;  Surgeon: Frederico Hamman, MD;  Location: [facility];  Service: Obstetrics;  Laterality: N/A;  Repeat Cesarean Section with birth of baby A Boy @ 2125, Baby B Girl  @ 2127    OB History    Gravida  6   Para  5   Term  4   Preterm  1   AB  1   Living  6     SAB  1   TAB  0   Ectopic  0   Multiple  1   Live Births  2            Home Medications    Prior to Admission medications   Medication Sig Start Date End Date Taking? Authorizing Provider  ibuprofen (ADVIL) 600 MG tablet Take 1 tablet (600 mg total) by mouth every 6 (six) hours as needed. 05/07/19   LampteyMyrene Galas, MD  levonorgestrel (MIRENA) 20 MCG/24HR IUD 1 each by Intrauterine route once. Patient not taking: Reported on 08/10/2019    [provider]  norgestimate-ethinyl estradiol (ORTHO-CYCLEN) 0.25-35 MG-MCG tablet Take 1 tablet by mouth daily. 08/10/19   Constant, Peggy, MD  polyethylene glycol (MIRALAX) 17 g packet Take 17 g by mouth daily as needed for severe constipation. Patient not taking: Reported on 08/10/2019 05/07/19   Chase Picket, MD    Family History History reviewed. No pertinent family  history.  Social History Social History   Tobacco Use  . Smoking status: Never Smoker  . Smokeless tobacco: Never Used  Substance Use Topics  . Alcohol use: No  . Drug use: No     Allergies   Patient has no known allergies.   Review of Systems Review of Systems  Constitutional: Negative for chills and fever.  HENT: Positive for ear pain (left ear pain). Negative for rhinorrhea, sinus pain and sore throat.   Eyes: Negative for pain and visual disturbance.  Respiratory: Negative for cough and shortness of breath.   Cardiovascular: Negative for chest pain and palpitations.  Gastrointestinal: Negative for abdominal pain and vomiting.  Genitourinary: Negative for dysuria and hematuria.  Musculoskeletal: Negative for arthralgias and back pain.  Skin: Negative for color change and rash.  Neurological: Negative for seizures and syncope.  All other systems reviewed and are negative.    Physical Exam Triage Vital Signs ED Triage Vitals [10/26/19 1603]  Enc Vitals Group     BP      Pulse Rate 86     Resp 17  Temp 98.5 F (36.9 C)     Temp Source Oral     SpO2 100 %     Weight      Height      Head Circumference      Peak Flow      Pain Score 5     Pain Loc      Pain Edu?      Excl. in [place]?    No data found.  Updated Vital Signs Pulse 86   Temp 98.5 F (36.9 C) (Oral)   Resp 17   SpO2 100%   Visual Acuity Right Eye Distance:   Left Eye Distance:   Bilateral Distance:    Right Eye Near:   Left Eye Near:    Bilateral Near:     Physical Exam Vitals and nursing note reviewed.  Constitutional:      General: She is not in acute distress.    Appearance: She is well-developed.  HENT:     Head: Normocephalic and atraumatic.     Right Ear: Ear canal normal. No decreased hearing noted. No drainage, swelling or tenderness. There is impacted cerumen.     Left Ear: Ear canal normal. No decreased hearing noted. No drainage, swelling or tenderness. There is  impacted cerumen.     Ears:     Comments: Unable to visualize TM due to cerumen impaction Eyes:     Conjunctiva/sclera: Conjunctivae normal.  Cardiovascular:     Rate and Rhythm: Normal rate and regular rhythm.     Heart sounds: No murmur heard.   Pulmonary:     Effort: Pulmonary effort is normal. No respiratory distress.     Breath sounds: Normal breath sounds.  Abdominal:     Palpations: Abdomen is soft.     Tenderness: There is no abdominal tenderness.  Musculoskeletal:     Cervical back: Neck supple.  Skin:    General: Skin is warm and dry.  Neurological:     Mental Status: She is alert.      UC Treatments / Results  Labs (all labs ordered are listed, but only abnormal results are displayed) Labs Reviewed - No data to display  EKG   Radiology No results found.  Procedures Procedures (including critical care time)  Medications Ordered in UC Medications - No data to display  Initial Impression / Assessment and Plan / UC Course  I have reviewed the triage vital signs and the nursing notes.  Pertinent labs & imaging results that were available during my care of the patient were reviewed by me and considered in my medical decision making (see chart for details).     Cerumen impaction bilaterally.  Unable to remove all ear wax during visit.  Recommend Debrox at home.  Advised pt not to use q-tips etc.  She will follow up after several days of debrox use for recheck/repeat ear lavage if necessary.   Final Clinical Impressions(s) / UC Diagnoses   Final diagnoses:  None   Discharge Instructions   None    ED Prescriptions    None     PDMP not reviewed this encounter.   Konrad Felix, PA-C 10/26/19 1656

## 2019-10-26 NOTE — ED Triage Notes (Signed)
Pt presents with left ear pain X 1 week.  

## 2019-10-26 NOTE — Discharge Instructions (Addendum)
Use over the counter Debrox over the next few days.   Return to clinic for recheck and repeat ear wax removal if needed.   Return sooner if you develop fever, increased ear pain.   Do not use q-tips or insert anything in the ears.

## 2019-12-18 ENCOUNTER — Ambulatory Visit (HOSPITAL_COMMUNITY)
Admission: EM | Admit: 2019-12-18 | Discharge: 2019-12-18 | Disposition: A | Discharge status: Home / Self Care | Payer: 59 | Attending: Emergency Medicine | Admitting: Emergency Medicine

## 2019-12-18 ENCOUNTER — Encounter (HOSPITAL_COMMUNITY): Payer: Self-pay

## 2019-12-18 ENCOUNTER — Ambulatory Visit (INDEPENDENT_AMBULATORY_CARE_PROVIDER_SITE_OTHER): Payer: 59

## 2019-12-18 ENCOUNTER — Other Ambulatory Visit: Payer: Self-pay

## 2019-12-18 DIAGNOSIS — R42 Dizziness and giddiness: Secondary | ICD-10-CM | POA: Diagnosis not present

## 2019-12-18 DIAGNOSIS — R079 Chest pain, unspecified: Secondary | ICD-10-CM | POA: Diagnosis not present

## 2019-12-18 DIAGNOSIS — R0789 Other chest pain: Secondary | ICD-10-CM | POA: Diagnosis not present

## 2019-12-18 MED ORDER — IBUPROFEN 800 MG PO TABS
800.0000 mg | ORAL_TABLET | Freq: Once | ORAL | Status: AC
  Administered 2019-12-18: 800 mg via ORAL

## 2019-12-18 MED ORDER — LIDOCAINE VISCOUS HCL 2 % MT SOLN
15.0000 mL | Freq: Once | OROMUCOSAL | Status: AC
  Administered 2019-12-18: 15 mL via ORAL

## 2019-12-18 MED ORDER — OMEPRAZOLE 20 MG PO CPDR: 20.0000 mg | DELAYED_RELEASE_CAPSULE | Freq: Every day | ORAL | 0 refills | Status: DC

## 2019-12-18 MED ORDER — IBUPROFEN 800 MG PO TABS
ORAL_TABLET | ORAL | Status: AC
  Filled 2019-12-18: qty 1

## 2019-12-18 MED ORDER — ACETAMINOPHEN 500 MG PO TABS: 500.0000 mg | ORAL_TABLET | Freq: Four times a day (QID) | ORAL | 0 refills | Status: DC | PRN

## 2019-12-18 MED ORDER — ALUM & MAG HYDROXIDE-SIMETH 200-200-20 MG/5ML PO SUSP
30.0000 mL | Freq: Once | ORAL | Status: AC
  Administered 2019-12-18: 30 mL via ORAL

## 2019-12-18 MED ORDER — ALUM & MAG HYDROXIDE-SIMETH 200-200-20 MG/5ML PO SUSP
ORAL | Status: AC
  Filled 2019-12-18: qty 30

## 2019-12-18 MED ORDER — LIDOCAINE VISCOUS HCL 2 % MT SOLN
OROMUCOSAL | Status: AC
  Filled 2019-12-18: qty 15

## 2019-12-18 NOTE — ED Provider Notes (Signed)
[place]    CSN: [id number] Arrival date & time: 12/18/19  1654      History   Chief Complaint Chief Complaint  Patient presents with   Chest Pain   Dizziness    HPI Carol Walls is a 39 y.o. female.   Carol Walls presents with complaints of an episode today at work while walking after lunch, she felt left sided chest pain, dizziness, sweating and shortness of breath.  Lasted approximately 30 minutes. Now with some left sided chest pain but no further dizziness, sweating, or shortness of breath . No arm pain. No nausea. No jaw pain. No pain with breathing. No cough or uri symptoms. No leg pain. Denies any family cardiac history. She is not on any hormone therapy. No recent immobilization. Has had similar episode in the past (05/2018) and was evaluated in the ER, negative for ACS at that time. She states this felt the same. No abdominal pain. Denies any increased stress or overexertion at work. Discussed limitations of evaluation of CP here in UC, patient and son would like to proceed, with strict ER precautions.    ROS per HPI, negative if not otherwise mentioned.      Past Medical History:  Diagnosis Date   Back pain    No pertinent past medical history     Patient Active Problem List   Diagnosis Date Noted   Left flank pain 09/05/2015   Low back pain 06/25/2014   LLQ pain 06/25/2014    Past Surgical History:  Procedure Laterality Date   CESAREAN SECTION     CESAREAN SECTION  12/06/2011   Procedure: CESAREAN SECTION;  Surgeon: Frederico Hamman, MD;  Location: [facility];  Service: Obstetrics;  Laterality: N/A;  Repeat Cesarean Section with birth of baby A Boy @ 2125, Baby B Girl  @ 2127    OB History    Gravida  6   Para  5   Term  4   Preterm  1   AB  1   Living  6     SAB  1   TAB  0   Ectopic  0   Multiple  1   Live Births  2            Home Medications    Prior to Admission medications   Medication Sig  Start Date End Date Taking? Authorizing Provider  acetaminophen (TYLENOL) 500 MG tablet Take 1 tablet (500 mg total) by mouth every 6 (six) hours as needed. 12/18/19   Zigmund Gottron, NP  ibuprofen (ADVIL) 600 MG tablet Take 1 tablet (600 mg total) by mouth every 6 (six) hours as needed. 05/07/19   LampteyMyrene Galas, MD  levonorgestrel (MIRENA) 20 MCG/24HR IUD 1 each by Intrauterine route once. Patient not taking: Reported on 08/10/2019    [provider]  norgestimate-ethinyl estradiol (ORTHO-CYCLEN) 0.25-35 MG-MCG tablet Take 1 tablet by mouth daily. 08/10/19   Constant, Peggy, MD  omeprazole (PRILOSEC) 20 MG capsule Take 1 capsule (20 mg total) by mouth daily. 12/18/19   Zigmund Gottron, NP  polyethylene glycol (MIRALAX) 17 g packet Take 17 g by mouth daily as needed for severe constipation. Patient not taking: Reported on 08/10/2019 05/07/19   Chase Picket, MD    Family History History reviewed. No pertinent family history.  Social History Social History   Tobacco Use   Smoking status: Never Smoker   Smokeless tobacco: Never Used  Substance Use  Topics   Alcohol use: No   Drug use: No     Allergies   Patient has no known allergies.   Review of Systems Review of Systems   Physical Exam Triage Vital Signs ED Triage Vitals  Enc Vitals Group     BP 12/18/19 1710 (!) 150/87     Pulse Rate 12/18/19 1710 88     Resp --      Temp 12/18/19 1710 97.9 F (36.6 C)     Temp Source 12/18/19 1710 Oral     SpO2 12/18/19 1710 100 %     Weight --      Height --      Head Circumference --      Peak Flow --      Pain Score 12/18/19 1715 4     Pain Loc --      Pain Edu? --      Excl. in [place]? --    No data found.  Updated Vital Signs BP (!) 150/87 (BP Location: Right Arm)    Pulse 88    Temp 97.9 F (36.6 C) (Oral)    LMP 12/04/2019 Comment: 3 weeks   SpO2 100%   Visual Acuity Right Eye Distance:   Left Eye Distance:   Bilateral Distance:    Right Eye Near:    Left Eye Near:    Bilateral Near:     Physical Exam Constitutional:      General: She is not in acute distress.    Appearance: She is well-developed.  Cardiovascular:     Rate and Rhythm: Normal rate and regular rhythm.     Heart sounds: Normal heart sounds.  Pulmonary:     Effort: Pulmonary effort is normal.     Breath sounds: Normal breath sounds.  Chest:     Chest wall: Tenderness present.    Skin:    General: Skin is warm and dry.  Neurological:     Mental Status: She is alert and oriented to person, place, and time.     EKG:  NSR rate of 83 . Previous EKG was available for review. No stwave changes as interpreted by me.    UC Treatments / Results  Labs (all labs ordered are listed, but only abnormal results are displayed) Labs Reviewed - No data to display  EKG   Radiology DG Chest 2 View  Result Date: 12/18/2019 CLINICAL DATA:  Chest pain and dizziness for 1 day EXAM: CHEST - 2 VIEW COMPARISON:  05/27/2018 FINDINGS: The heart size and mediastinal contours are within normal limits. Both lungs are clear. The visualized skeletal structures are unremarkable. IMPRESSION: No active cardiopulmonary disease. Electronically Signed   By: Inez Catalina M.D.   On: 12/18/2019 19:19    Procedures Procedures (including critical care time)  Medications Ordered in UC Medications  alum & mag hydroxide-simeth (MAALOX/MYLANTA) 200-200-20 MG/5ML suspension 30 mL (30 mLs Oral Given 12/18/19 1837)    And  lidocaine (XYLOCAINE) 2 % viscous mouth solution 15 mL (15 mLs Oral Given 12/18/19 1837)  ibuprofen (ADVIL) tablet 800 mg (800 mg Oral Given 12/18/19 1835)    Initial Impression / Assessment and Plan / UC Course  I have reviewed the triage vital signs and the nursing notes.  Pertinent labs & imaging results that were available during my care of the patient were reviewed by me and considered in my medical decision making (see chart for details).     ekg and cxr without acute  findings. Continued  improvement of symptoms here in uc with ibuprofen and gi cocktail, she states the pain comes and goes. Reproducible on palpation. Patient declines evaluation in the ER today for troponins, as acs in considered. No cardiac risk factors, no PE risk factors. musculoskeletal and gi sources of pain also considered. Encouraged establish with a PCP for follow up if this persists. Strict er precautions provided. Patient and son verbalized understanding and agreeable to plan.  Ambulatory out of clinic without difficulty.   Final Clinical Impressions(s) / UC Diagnoses   Final diagnoses:  Chest wall pain     Discharge Instructions     Tylenol as needed for pain.  Please start omeprazole as needed for pain.  Please go to the ER if worsening.  Please follow up with your primary care provider for recheck next week.     ED Prescriptions    Medication Sig Dispense Auth. Provider   omeprazole (PRILOSEC) 20 MG capsule Take 1 capsule (20 mg total) by mouth daily. 30 capsule Augusto Gamble B, NP   acetaminophen (TYLENOL) 500 MG tablet Take 1 tablet (500 mg total) by mouth every 6 (six) hours as needed. 30 tablet Zigmund Gottron, NP     PDMP not reviewed this encounter.   Zigmund Gottron, NP 12/19/19 1144

## 2019-12-18 NOTE — Discharge Instructions (Signed)
Tylenol as needed for pain.  Please start omeprazole as needed for pain.  Please go to the ER if worsening.  Please follow up with your primary care provider for recheck next week.

## 2019-12-18 NOTE — ED Triage Notes (Signed)
Pt started having left-sided chest pain for the past 3 hrs. States she was walking with a friend and started feeling dizzy, sweating and having the chest pain. Denies sob, diarrhea, numbness or tingling in arms. Reports she had 1 episode like this one, 1 year ago.

## 2019-12-25 ENCOUNTER — Ambulatory Visit (INDEPENDENT_AMBULATORY_CARE_PROVIDER_SITE_OTHER): Payer: 59 | Admitting: Internal Medicine

## 2019-12-25 ENCOUNTER — Ambulatory Visit (HOSPITAL_COMMUNITY)
Admission: RE | Admit: 2019-12-25 | Discharge: 2019-12-25 | Disposition: A | Discharge status: Home / Self Care | Payer: 59 | Source: Ambulatory Visit | Attending: Internal Medicine | Admitting: Internal Medicine

## 2019-12-25 ENCOUNTER — Encounter: Payer: Self-pay | Admitting: Internal Medicine

## 2019-12-25 ENCOUNTER — Other Ambulatory Visit: Payer: Self-pay

## 2019-12-25 VITALS — BP 132/85 | HR 95 | Temp 98.9°F | Ht 66.0 in | Wt 176.7 lb

## 2019-12-25 DIAGNOSIS — R002 Palpitations: Secondary | ICD-10-CM

## 2019-12-25 DIAGNOSIS — R5383 Other fatigue: Secondary | ICD-10-CM | POA: Diagnosis not present

## 2019-12-25 DIAGNOSIS — R0602 Shortness of breath: Secondary | ICD-10-CM | POA: Insufficient documentation

## 2019-12-25 DIAGNOSIS — Z3202 Encounter for pregnancy test, result negative: Secondary | ICD-10-CM | POA: Diagnosis not present

## 2019-12-25 DIAGNOSIS — R55 Syncope and collapse: Secondary | ICD-10-CM | POA: Insufficient documentation

## 2019-12-25 LAB — POCT URINE PREGNANCY: Preg Test, Ur: NEGATIVE

## 2019-12-25 NOTE — Patient Instructions (Signed)
Thank you for allowing [place] to provide your care today. Today you were seen to discuss your chest pain.  Your physical exam and EKG were reassuring but as you report episode of syncope and irregular heart rate, we should refer you to a cardiologist for more evaluation. Some one from their office will call you for the appointment.  As always, if you develop any sever shortness of breath, palpitation, chest pain, or other new sever symptoms, make sure to seek medical attention, go to emergency room or call 911. I have ordered some labs for you. I will call if any are abnormal.    Today we did not start a new medication for you.   Please follow-up in 1 month to establish care or sooner as needed.   Should you have any questions or concerns please call the internal medicine clinic at [phone].           .     .        EKG                     .       .                                           911.     .       .       .           Marland Kitchen                [phone].

## 2019-12-25 NOTE — Assessment & Plan Note (Signed)
No weight changes. No depression. Reports nl appetite. No heavy menstural bleeding. Will do basic labs to evaluate for anemia, thyroid disorder,.. Patient also asked to be checked for pregnancy. She denies missed period but states she would like to be checked because cycles are sometimes irregular.   -CBC, CMP, TSH

## 2019-12-25 NOTE — Assessment & Plan Note (Signed)
Patient with 3 episode of palpitation, irregular heart rate and SOB in past 3 years. One was during sleep and one was at rest and associated with syncope. The last episode was last week and when she was walking with her friends, when she felt short of breath, palpitation and chest pain. EKG and trop in ED were normal and she was told that her symptoms where likely due to GERD.  She mentions she has FHx of heart disease and is worried about her symptoms. Her brother passed away at age 40 at home due to heart attack. Her sister passed away at age 37 (per patient, she had fluid at her long but she does not know the details). Patient had  The episode each took between 5-10 minutes.  Pt with no known PMHx of CAD, HTN, pulm disease. No Hx of smoking or alcohol use. Reports recent fatigue with activity but no issue at work and daily activity. Denies heavy bleeding. Denies symptoms of thyroid malfunction.  Brief work up at [employer] 2 unremarkable.   -Currently asymptomatic -Ph/e unremarkable.  -EKG: Normal sinus rhythm with heart rate of 79.  T inversion in V1 and almost flat T at V2 but otherwise unremarkable. -Given report of irregular heart rate that 1 is associated with syncopal episode, is concerning for arrhythmia and needs further evaluation by cardiology. -I hold off of ordering echo and refer her to cards. -Also, I hold off of ordering short term monitoring by ziopatch or holter as her episodes were scattered. Appreciate cardiology recommendation. -ED visit precautions provided -Will alos sending basic labs to assess other etiology or tachycardia, SOB and fatigue such as anemia, electrolytes abnormality (less likely), TSH

## 2019-12-25 NOTE — Progress Notes (Signed)
New Patient Office Visit  Subjective:  Patient ID: Carol Walls, female    DOB: [birth year]  Age: 39 y.o. MRN: [id number]  CC:  Chief Complaint  Patient presents with  . Establish Care  Chest pain, cardiology referral, establish care  HPI Carol Walls is a 38 y/o female with PMHx as documented below, presented to discuss episodes of palpitation, shortness of breath and chest pain.  She would like to be referred to cardiology.  And also would like to establish care with [place].                                                                                           Please refer to problem based charting for further details and assessment and plan of current problem and chronic medical conditions. Language interpreter assisted with translation.  Past Medical History:  Diagnosis Date  . Back pain   . No pertinent past medical history     Past Surgical History:  Procedure Laterality Date  . CESAREAN SECTION    . CESAREAN SECTION  12/06/2011   Procedure: CESAREAN SECTION;  Surgeon: Frederico Hamman, MD;  Location: [facility];  Service: Obstetrics;  Laterality: N/A;  Repeat Cesarean Section with birth of baby A Boy @ 2125, 59 B Girl  @ 2127    Family History  Problem Relation Age of Onset  . Heart attack Brother 82    Social History   Socioeconomic History  . Marital status: Married    Spouse name: Not on file  . Number of children: Not on file  . Years of education: Not on file  . Highest education level: Not on file  Occupational History  . Not on file  Tobacco Use  . Smoking status: Never Smoker  . Smokeless tobacco: Never Used  Substance and Sexual Activity  . Alcohol use: Never  . Drug use: Never  . Sexual activity: Yes    Birth control/protection: None  Other Topics Concern  . Not on file  Social History Narrative  . Not on file   Social Determinants of Health   Financial Resource Strain:   . Difficulty of Paying Living Expenses: Not on file  Food  Insecurity:   . Worried About Charity fundraiser in the Last Year: Not on file  . Ran Out of Food in the Last Year: Not on file  Transportation Needs:   . Lack of Transportation (Medical): Not on file  . Lack of Transportation (Non-Medical): Not on file  Physical Activity:   . Days of Exercise per Week: Not on file  . Minutes of Exercise per Session: Not on file  Stress:   . Feeling of Stress : Not on file  Social Connections:   . Frequency of Communication with Friends and Family: Not on file  . Frequency of Social Gatherings with Friends and Family: Not on file  . Attends Religious Services: Not on file  . Active Member of Clubs or Organizations: Not on file  . Attends Archivist Meetings: Not on file  . Marital Status: Not on file  Intimate Partner Violence:   .  Fear of Current or Ex-Partner: Not on file  . Emotionally Abused: Not on file  . Physically Abused: Not on file  . Sexually Abused: Not on file    ROS Review of Systems  Objective:   Today's Vitals: BP 132/85 (BP Location: Left Arm, Patient Position: Sitting, Cuff Size: Large)   Pulse 95   Temp 98.9 F (37.2 C) (Oral)   Ht 5\' 6"  (1.676 m)   Wt 176 lb 11.2 oz (80.2 kg)   LMP 12/04/2019 Comment: 3 weeks  SpO2 100% Comment: room air  BMI 28.52 kg/m   Physical Exam Constitutional: Well-developed and well-nourished. No acute distress.  HENT:  Head: Normocephalic and atraumatic.  Eyes: Conjunctivae are normal, EOM nl Cardiovascular: RRR, nl S1S2, no murmur,  no LEE Respiratory: Effort normal and breath sounds normal. No respiratory distress. No wheezes.  GI: Soft. No distension. There is no tenderness.  Neurological: Is alert and oriented x 3  Skin: Not diaphoretic. No erythema.  Psychiatric: Normal mood and affect. Behavior is normal. Judgment and thought content normal.   Assessment & Plan:   Problem List Items Addressed This Visit      Other   Fatigue - Primary    No weight changes. No  depression. Reports nl appetite. No heavy menstural bleeding. Will do basic labs to evaluate for anemia, thyroid disorder,.. Patient also asked to be checked for pregnancy. She denies missed period but states she would like to be checked because cycles are sometimes irregular.   -CBC, CMP, TSH       Relevant Orders   CMP14 + Anion Gap   CBC no Diff   POCT Urine Pregnancy (Completed)   Palpitation   Relevant Orders   EKG 12-Lead (Completed)   TSH   Syncope    Patient with 3 episode of palpitation, irregular heart rate and SOB in past 3 years. One was during sleep and one was at rest and associated with syncope. The last episode was last week and when she was walking with her friends, when she felt short of breath, palpitation and chest pain. EKG and trop in ED were normal and she was told that her symptoms where likely due to GERD.  She mentions she has FHx of heart disease and is worried about her symptoms. Her brother passed away at age 58 at home due to heart attack. Her sister passed away at age 71 (per patient, she had fluid at her long but she does not know the details). Patient had  The episode each took between 5-10 minutes.  Pt with no known PMHx of CAD, HTN, pulm disease. No Hx of smoking or alcohol use. Reports recent fatigue with activity but no issue at work and daily activity. Denies heavy bleeding. Denies symptoms of thyroid malfunction.  Brief work up at [employer] 2 unremarkable.   -Currently asymptomatic -Ph/e unremarkable.  -EKG: Normal sinus rhythm with heart rate of 79.  T inversion in V1 and almost flat T at V2 but otherwise unremarkable. -Given report of irregular heart rate that 1 is associated with syncopal episode, is concerning for arrhythmia and needs further evaluation by cardiology. -I hold off of ordering echo and refer her to cards. -Also, I hold off of ordering short term monitoring by ziopatch or holter as her episodes were scattered. Appreciate cardiology  recommendation. -ED visit precautions provided -Will alos sending basic labs to assess other etiology or tachycardia, SOB and fatigue such as anemia, electrolytes abnormality (less likely), TSH  Relevant Orders   Ambulatory referral to Cardiology      Outpatient Encounter Medications as of 12/25/2019  Medication Sig  . acetaminophen (TYLENOL) 500 MG tablet Take 1 tablet (500 mg total) by mouth every 6 (six) hours as needed.  Marland Kitchen ibuprofen (ADVIL) 600 MG tablet Take 1 tablet (600 mg total) by mouth every 6 (six) hours as needed.  Marland Kitchen levonorgestrel (MIRENA) 20 MCG/24HR IUD 1 each by Intrauterine route once. (Patient not taking: Reported on 08/10/2019)  . norgestimate-ethinyl estradiol (ORTHO-CYCLEN) 0.25-35 MG-MCG tablet Take 1 tablet by mouth daily.  Marland Kitchen omeprazole (PRILOSEC) 20 MG capsule Take 1 capsule (20 mg total) by mouth daily.  . polyethylene glycol (MIRALAX) 17 g packet Take 17 g by mouth daily as needed for severe constipation. (Patient not taking: Reported on 08/10/2019)   No facility-administered encounter medications on file as of 12/25/2019.   Patient discussed with Dr. Jimmye Norman. Follow-up: Return in about 4 weeks (around 01/22/2020) for To establish care and first PCP visit.   Dewayne Hatch, MD

## 2019-12-26 LAB — CBC
Hematocrit: 36.1 % (ref 34.0–46.6)
Hemoglobin: 12.8 g/dL (ref 11.1–15.9)
MCH: 30.3 pg (ref 26.6–33.0)
MCHC: 35.5 g/dL (ref 31.5–35.7)
MCV: 85 fL (ref 79–97)
Platelets: 134 10*3/uL — ABNORMAL LOW (ref 150–450)
RBC: 4.23 x10E6/uL (ref 3.77–5.28)
RDW: 12.2 % (ref 11.7–15.4)
WBC: 3.1 10*3/uL — ABNORMAL LOW (ref 3.4–10.8)

## 2019-12-26 LAB — CMP14 + ANION GAP
ALT: 14 IU/L (ref 0–32)
AST: 14 IU/L (ref 0–40)
Albumin/Globulin Ratio: 1.6 (ref 1.2–2.2)
Albumin: 4.4 g/dL (ref 3.8–4.8)
Alkaline Phosphatase: 57 IU/L (ref 44–121)
Anion Gap: 13 mmol/L (ref 10.0–18.0)
BUN/Creatinine Ratio: 23 (ref 9–23)
BUN: 14 mg/dL (ref 6–24)
Bilirubin Total: <0.2 mg/dL (ref 0.0–1.2)
CO2: 22 mmol/L (ref 20–29)
Calcium: 9.3 mg/dL (ref 8.7–10.2)
Chloride: 104 mmol/L (ref 96–106)
Creatinine, Ser: 0.61 mg/dL (ref 0.57–1.00)
GFR calc Af Amer: 130 mL/min/{1.73_m2} (ref >59)
GFR calc non Af Amer: 113 mL/min/{1.73_m2} (ref >59)
Globulin, Total: 2.7 g/dL (ref 1.5–4.5)
Glucose: 105 mg/dL — ABNORMAL HIGH (ref 65–99)
Potassium: 4.2 mmol/L (ref 3.5–5.2)
Sodium: 139 mmol/L (ref 134–144)
Total Protein: 7.1 g/dL (ref 6.0–8.5)

## 2019-12-26 LAB — TSH: TSH: 2.87 u[IU]/mL (ref 0.450–4.500)

## 2020-01-01 NOTE — Progress Notes (Signed)
Internal Medicine Clinic Attending  Case discussed with Dr. Myrtie Hawk  At the time of the visit.  We reviewed the resident's history and exam and pertinent patient test results.  I agree with the assessment, diagnosis, and plan of care documented in the resident's note. Cardiology referral appropriate given possibility of arrhythmia-associated LOC.

## 2020-01-23 NOTE — Progress Notes (Signed)
Cardiology Office Note:    Date:  01/25/2020   ID:  Carol Walls, DOB 1980/05/24, MRN [id number]  PCP:  Patient, No Pcp Per  [facility] Cardiologist:  Freada Bergeron, MD  [facility] Electrophysiologist:  None   Referring MD: Velna Ochs, MD    History of Present Illness:    Carol Walls is a 39 y.o. female with no significant past medical history who was referred by Dr. Philipp Ovens for evaluation of palpitations with associated shortness of breath and chest pain.  Patient states that she has been having intermittent palpitations at home with associated shortness of breath, chest pain,  and fatigue. Each episode last about several minutes before it resolves. She feels like "she loses control of her body and vision at that time." Improves with resting/napping. No syncope. Has had 3 total episodes with last occurring in October. No LE edema, orthopnea, PND. She is otherwise healthy but has a strong family history of CAD and cardiomyopathy.  She is active and able to walk up a flight of stairs without issues. Has 5 children with no pregnancy complications. Currently still having monthly menses. Uses IUD for birth control.  Family history: Brother with MI at age 38, Father with MI about age 13; Sister with lung disease that "affected her heart" and was deceased at age 51.   TSH normal. HgB 12.8.  Past Medical History:  Diagnosis Date  . Back pain   . No pertinent past medical history     Past Surgical History:  Procedure Laterality Date  . CESAREAN SECTION    . CESAREAN SECTION  12/06/2011   Procedure: CESAREAN SECTION;  Surgeon: Frederico Hamman, MD;  Location: [facility];  Service: Obstetrics;  Laterality: N/A;  Repeat Cesarean Section with birth of baby A Boy @ 2125, Baby B Girl  @ 2127    Current Medications: Current Meds  Medication Sig  . acetaminophen (TYLENOL) 500 MG tablet Take 1 tablet (500 mg total) by mouth every 6 (six) hours as needed.  Marland Kitchen ibuprofen  (ADVIL) 600 MG tablet Take 1 tablet (600 mg total) by mouth every 6 (six) hours as needed.  Marland Kitchen omeprazole (PRILOSEC) 20 MG capsule Take 1 capsule (20 mg total) by mouth daily.  . polyethylene glycol (MIRALAX) 17 g packet Take 17 g by mouth daily as needed for severe constipation.     Allergies:   Patient has no known allergies.   Social History   Socioeconomic History  . Marital status: Married    Spouse name: Not on file  . Number of children: Not on file  . Years of education: Not on file  . Highest education level: Not on file  Occupational History  . Not on file  Tobacco Use  . Smoking status: Never Smoker  . Smokeless tobacco: Never Used  Substance and Sexual Activity  . Alcohol use: Never  . Drug use: Never  . Sexual activity: Yes    Birth control/protection: None  Other Topics Concern  . Not on file  Social History Narrative  . Not on file   Social Determinants of Health   Financial Resource Strain: Not on file  Food Insecurity: Not on file  Transportation Needs: Not on file  Physical Activity: Not on file  Stress: Not on file  Social Connections: Not on file     Family History: The patient's family history includes Heart attack (age of onset: 20) in her brother.  ROS:   Please see the  history of present illness.    Review of Systems  Constitutional: Negative for chills, fever, malaise/fatigue and weight loss.  HENT: Negative for nosebleeds.   Eyes: Negative for photophobia.  Respiratory: Positive for shortness of breath. Negative for cough.   Cardiovascular: Positive for chest pain and palpitations. Negative for orthopnea, claudication, leg swelling and PND.  Gastrointestinal: Negative for heartburn, nausea and vomiting.  Genitourinary: Negative for frequency.  Musculoskeletal: Negative for myalgias.  Neurological: Positive for dizziness. Negative for loss of consciousness.  Endo/Heme/Allergies: Negative for polydipsia.  Psychiatric/Behavioral: Negative  for substance abuse.    EKGs/Labs/Other Studies Reviewed:    The following studies were reviewed today: CT abdomen/pelvis 09/2015: FINDINGS: Lung bases:  Clear.  Heart normal in size.  Liver, spleen, gallbladder, pancreas, adrenal glands:  Normal.  Kidneys, ureters, bladder:  Normal.  Uterus and adnexa: Well-positioned IUD. Uterus otherwise unremarkable. No adnexal masses.  Lymph nodes:  No adenopathy.  Vascular:  Normal.  Ascites:  None.  Gastrointestinal:  Normal.  Normal appendix visualized.  Musculoskeletal:  Unremarkable.  IMPRESSION: Normal unenhanced CT scan the abdomen pelvis. No findings to explain left lower quadrant pain  EKG:  The ekg ordered 12/25/19 demonstrates NSR with no ischemia or block  Recent Labs: 12/25/2019: ALT 14; BUN 14; Creatinine, Ser 0.61; Hemoglobin 12.8; Platelets 134; Potassium 4.2; Sodium 139; TSH 2.870  Recent Lipid Panel    Component Value Date/Time   CHOL 218 (H) 04/20/2009 2142   TRIG 100 04/20/2009 2142   HDL 64 04/20/2009 2142   CHOLHDL 3.4 Ratio 04/20/2009 2142   VLDL 20 04/20/2009 2142   LDLCALC 134 (H) 04/20/2009 2142     Physical Exam:    VS:  BP 116/60   Pulse 93   Ht 5\' 6"  (1.676 m)   Wt 179 lb (81.2 kg)   SpO2 98%   BMI 28.89 kg/m     Wt Readings from Last 3 Encounters:  01/25/20 179 lb (81.2 kg)  12/25/19 176 lb 11.2 oz (80.2 kg)  08/10/19 175 lb 12.8 oz (79.7 kg)     GEN:  Well nourished, well developed in no acute distress HEENT: Normal NECK: No JVD; No carotid bruits CARDIAC: RRR, 1/6 systolic murmur, rubs, gallops RESPIRATORY:  Clear to auscultation without rales, wheezing or rhonchi  ABDOMEN: Soft, non-tender, non-distended MUSCULOSKELETAL:  No edema; No deformity  SKIN: Warm and dry NEUROLOGIC:  Alert and oriented x 3 PSYCHIATRIC:  Normal affect   ASSESSMENT:    1. Palpitation   2. Chest pain, unspecified type    PLAN:    In order of problems listed  above:  #Palpitations: #Family history of premature CAD Patient with 3 episodes of palpitations with associated chest discomfort, dizziness and shortness of breath over the past several months. Each episode lasted about 68minutes and resolved with resting or laying down.  No syncope. No known personal history of CAD but has very strong family history of early CAD (brother) and cardiomypathies (brother and sister). -Check TTE -Check 30day event monitor; if unable to capture palpitations, can consider ILR  -Will check lipid profile for risk stratification   Medication Adjustments/Labs and Tests Ordered: Current medicines are reviewed at length with the patient today.  Concerns regarding medicines are outlined above.  Orders Placed This Encounter  Procedures  . Lipid Profile  . CARDIAC EVENT MONITOR  . ECHOCARDIOGRAM COMPLETE   No orders of the defined types were placed in this encounter.   Patient Instructions  Medication Instructions:  Your physician recommends  that you continue on your current medications as directed. Please refer to the Current Medication list given to you today.  *If you need a refill on your cardiac medications before your next appointment, please call your pharmacy*   Lab Work: Your physician recommends that you return for lab work in: 3 months on day of appointment with Dr Vieno Tarrant--Lipid profile.  This will be fasting  If you have labs (blood work) drawn today and your tests are completely normal, you will receive your results only by: Marland Kitchen MyChart Message (if you have MyChart) OR . A paper copy in the mail If you have any lab test that is abnormal or we need to change your treatment, we will call you to review the results.   Testing/Procedures: Your physician has recommended that you wear an event monitor. Event monitors are medical devices that record the heart's electrical activity. Doctors most often [place] these monitors to diagnose arrhythmias. Arrhythmias  are problems with the speed or rhythm of the heartbeat. The monitor is a small, portable device. You can wear one while you do your normal daily activities. This is usually used to diagnose what is causing palpitations/syncope (passing out).  Your physician has requested that you have an echocardiogram. Echocardiography is a painless test that uses sound waves to create images of your heart. It provides your doctor with information about the size and shape of your heart and how well your heart's chambers and valves are working. This procedure takes approximately one hour. There are no restrictions for this procedure.     Follow-Up: At [facility], you and your health needs are our priority.  As part of our continuing mission to provide you with exceptional heart care, we have created designated Provider Care Teams.  These Care Teams include your primary Cardiologist (physician) and Advanced Practice Providers (APPs -  Physician Assistants and Nurse Practitioners) who all work together to provide you with the care you need, when you need it.  We recommend signing up for the patient portal called "MyChart".  Sign up information is provided on this After Visit Summary.  MyChart is used to connect with patients for Virtual Visits (Telemedicine).  Patients are able to view lab/test results, encounter notes, upcoming appointments, etc.  Non-urgent messages can be sent to your provider as well.   To learn more about what you can do with MyChart, go to [email or web address].    Your next appointment:   3 month(s)  The format for your next appointment:   In Person  Provider:   Gwyndolyn Kaufman, MD   Other Instructions  [employer] Cardiac Event Monitor Instructions Your physician has requested you wear your cardiac event monitor for _30____ days, (1-30). [employer] may call or text to confirm a shipping address. The monitor will be sent to a land address via UPS. [employer] will not ship a  monitor to a PO BOX. It typically takes 3-5 days to receive your monitor after it has been enrolled. [employer] will assist with USPS tracking if your package is delayed. The telephone number for [employer] is [phone]. Once you have received your monitor, please review the enclosed instructions. Instruction tutorials can also be viewed under help and settings on the enclosed cell phone. Your monitor has already been registered assigning a specific monitor serial # to you.  Applying the monitor Remove cell phone from case and turn it on. The cell phone works as Dealer and needs to be within [employer] of you at all times.  The cell phone will need to be charged on a daily basis. We recommend you plug the cell phone into the enclosed charger at your bedside table every night.  Monitor batteries: You will receive two monitor batteries labelled #1 and #2. These are your recorders. Plug battery #2 onto the second connection on the enclosed charger. Keep one battery on the charger at all times. This will keep the monitor battery deactivated. It will also keep it fully charged for when you need to switch your monitor batteries. A small light will be blinking on the battery emblem when it is charging. The light on the battery emblem will remain on when the battery is fully charged.  Open package of a Monitor strip. Insert battery #1 into black hood on strip and gently squeeze monitor battery onto connection as indicated in instruction booklet. Set aside while preparing skin.  Choose location for your strip, vertical or horizontal, as indicated in the instruction booklet. Shave to remove all hair from location. There cannot be any lotions, oils, powders, or colognes on skin where monitor is to be applied. Wipe skin clean with enclosed Saline wipe. Dry skin completely.  Peel paper labeled #1 off the back of the Monitor strip exposing the adhesive. Place the monitor on the chest in the  vertical or horizontal position shown in the instruction booklet. One arrow on the monitor strip must be pointing upward. Carefully remove paper labeled #2, attaching remainder of strip to your skin. Try not to create any folds or wrinkles in the strip as you apply it.  Firmly press and release the circle in the center of the monitor battery. You will hear a small beep. This is turning the monitor battery on. The heart emblem on the monitor battery will light up every 5 seconds if the monitor battery in turned on and connected to the patient securely. Do not push and hold the circle down as this turns the monitor battery off. The cell phone will locate the monitor battery. A screen will appear on the cell phone checking the connection of your monitor strip. This may read poor connection initially but change to good connection within the next minute. Once your monitor accepts the connection you will hear a series of 3 beeps followed by a climbing crescendo of beeps. A screen will appear on the cell phone showing the two monitor strip placement options. Touch the picture that demonstrates where you applied the monitor strip.  Your monitor strip and battery are waterproof. You are able to shower, bathe, or swim with the monitor on. They just ask you do not submerge deeper than 3 feet underwater. We recommend removing the monitor if you are swimming in a lake, river, or ocean.  Your monitor battery will need to be switched to a fully charged monitor battery approximately once a week. The cell phone will alert you of an action which needs to be made.  On the cell phone, tap for details to reveal connection status, monitor battery status, and cell phone battery status. The green dots indicates your monitor is in good status. A red dot indicates there is something that needs your attention.  To record a symptom, click the circle on the monitor battery. In 30-60 seconds a list of symptoms will appear  on the cell phone. Select your symptom and tap save. Your monitor will record a sustained or significant arrhythmia regardless of you clicking the button. Some patients do not feel the heart rhythm irregularities. [employer]  will notify us of any serious or critical events.  Refer to instruction booklet for instructions on switching batteries, changing strips, the Do not disturb or Pause features, or any additional questions.  Call [employer] at [phone], to confirm your monitor is transmitting and record your baseline. They will answer any questions you may have regarding the monitor instructions at that time.  Returning the monitor to [place] all equipment back into blue box. Peel off strip of paper to expose adhesive and close box securely. There is a prepaid UPS shipping label on this box. Drop in a UPS drop box, or at a UPS facility like [employer]. You may also contact [employer] to arrange UPS to pick up monitor package at your home.     Signed, Freada Bergeron, MD  01/25/2020 12:26 PM    [place]

## 2020-01-25 ENCOUNTER — Other Ambulatory Visit: Payer: Self-pay

## 2020-01-25 ENCOUNTER — Encounter: Payer: Self-pay | Admitting: Cardiology

## 2020-01-25 ENCOUNTER — Ambulatory Visit (INDEPENDENT_AMBULATORY_CARE_PROVIDER_SITE_OTHER): Payer: 59 | Admitting: Cardiology

## 2020-01-25 VITALS — BP 116/60 | HR 93 | Ht 66.0 in | Wt 179.0 lb

## 2020-01-25 DIAGNOSIS — R079 Chest pain, unspecified: Secondary | ICD-10-CM

## 2020-01-25 DIAGNOSIS — R002 Palpitations: Secondary | ICD-10-CM | POA: Diagnosis not present

## 2020-01-25 NOTE — Patient Instructions (Signed)
Medication Instructions:  Your physician recommends that you continue on your current medications as directed. Please refer to the Current Medication list given to you today.  *If you need a refill on your cardiac medications before your next appointment, please call your pharmacy*   Lab Work: Your physician recommends that you return for lab work in: 3 months on day of appointment with Dr Pemberton--Lipid profile.  This will be fasting  If you have labs (blood work) drawn today and your tests are completely normal, you will receive your results only by: Marland Kitchen MyChart Message (if you have MyChart) OR . A paper copy in the mail If you have any lab test that is abnormal or we need to change your treatment, we will call you to review the results.   Testing/Procedures: Your physician has recommended that you wear an event monitor. Event monitors are medical devices that record the heart's electrical activity. Doctors most often [place] these monitors to diagnose arrhythmias. Arrhythmias are problems with the speed or rhythm of the heartbeat. The monitor is a small, portable device. You can wear one while you do your normal daily activities. This is usually used to diagnose what is causing palpitations/syncope (passing out).  Your physician has requested that you have an echocardiogram. Echocardiography is a painless test that uses sound waves to create images of your heart. It provides your doctor with information about the size and shape of your heart and how well your heart's chambers and valves are working. This procedure takes approximately one hour. There are no restrictions for this procedure.     Follow-Up: At [facility], you and your health needs are our priority.  As part of our continuing mission to provide you with exceptional heart care, we have created designated Provider Care Teams.  These Care Teams include your primary Cardiologist (physician) and Advanced Practice Providers (APPs -   Physician Assistants and Nurse Practitioners) who all work together to provide you with the care you need, when you need it.  We recommend signing up for the patient portal called "MyChart".  Sign up information is provided on this After Visit Summary.  MyChart is used to connect with patients for Virtual Visits (Telemedicine).  Patients are able to view lab/test results, encounter notes, upcoming appointments, etc.  Non-urgent messages can be sent to your provider as well.   To learn more about what you can do with MyChart, go to [email or web address].    Your next appointment:   3 month(s)  The format for your next appointment:   In Person  Provider:   Gwyndolyn Kaufman, MD   Other Instructions  [employer] Cardiac Event Monitor Instructions Your physician has requested you wear your cardiac event monitor for _30____ days, (1-30). [employer] may call or text to confirm a shipping address. The monitor will be sent to a land address via UPS. [employer] will not ship a monitor to a PO BOX. It typically takes 3-5 days to receive your monitor after it has been enrolled. [employer] will assist with USPS tracking if your package is delayed. The telephone number for [employer] is [phone]. Once you have received your monitor, please review the enclosed instructions. Instruction tutorials can also be viewed under help and settings on the enclosed cell phone. Your monitor has already been registered assigning a specific monitor serial # to you.  Applying the monitor Remove cell phone from case and turn it on. The cell phone works as Dealer and needs to be within [employer]  of you at all times. The cell phone will need to be charged on a daily basis. We recommend you plug the cell phone into the enclosed charger at your bedside table every night.  Monitor batteries: You will receive two monitor batteries labelled #1 and #2. These are your recorders. Plug battery #2 onto the  second connection on the enclosed charger. Keep one battery on the charger at all times. This will keep the monitor battery deactivated. It will also keep it fully charged for when you need to switch your monitor batteries. A small light will be blinking on the battery emblem when it is charging. The light on the battery emblem will remain on when the battery is fully charged.  Open package of a Monitor strip. Insert battery #1 into black hood on strip and gently squeeze monitor battery onto connection as indicated in instruction booklet. Set aside while preparing skin.  Choose location for your strip, vertical or horizontal, as indicated in the instruction booklet. Shave to remove all hair from location. There cannot be any lotions, oils, powders, or colognes on skin where monitor is to be applied. Wipe skin clean with enclosed Saline wipe. Dry skin completely.  Peel paper labeled #1 off the back of the Monitor strip exposing the adhesive. Place the monitor on the chest in the vertical or horizontal position shown in the instruction booklet. One arrow on the monitor strip must be pointing upward. Carefully remove paper labeled #2, attaching remainder of strip to your skin. Try not to create any folds or wrinkles in the strip as you apply it.  Firmly press and release the circle in the center of the monitor battery. You will hear a small beep. This is turning the monitor battery on. The heart emblem on the monitor battery will light up every 5 seconds if the monitor battery in turned on and connected to the patient securely. Do not push and hold the circle down as this turns the monitor battery off. The cell phone will locate the monitor battery. A screen will appear on the cell phone checking the connection of your monitor strip. This may read poor connection initially but change to good connection within the next minute. Once your monitor accepts the connection you will hear a series of  3 beeps followed by a climbing crescendo of beeps. A screen will appear on the cell phone showing the two monitor strip placement options. Touch the picture that demonstrates where you applied the monitor strip.  Your monitor strip and battery are waterproof. You are able to shower, bathe, or swim with the monitor on. They just ask you do not submerge deeper than 3 feet underwater. We recommend removing the monitor if you are swimming in a lake, river, or ocean.  Your monitor battery will need to be switched to a fully charged monitor battery approximately once a week. The cell phone will alert you of an action which needs to be made.  On the cell phone, tap for details to reveal connection status, monitor battery status, and cell phone battery status. The green dots indicates your monitor is in good status. A red dot indicates there is something that needs your attention.  To record a symptom, click the circle on the monitor battery. In 30-60 seconds a list of symptoms will appear on the cell phone. Select your symptom and tap save. Your monitor will record a sustained or significant arrhythmia regardless of you clicking the button. Some patients do not feel  the heart rhythm irregularities. [employer] will notify us of any serious or critical events.  Refer to instruction booklet for instructions on switching batteries, changing strips, the Do not disturb or Pause features, or any additional questions.  Call [employer] at [phone], to confirm your monitor is transmitting and record your baseline. They will answer any questions you may have regarding the monitor instructions at that time.  Returning the monitor to [place] all equipment back into blue box. Peel off strip of paper to expose adhesive and close box securely. There is a prepaid UPS shipping label on this box. Drop in a UPS drop box, or at a UPS facility like [employer]. You may also contact [employer] to arrange  UPS to pick up monitor package at your home.

## 2020-01-26 ENCOUNTER — Encounter: Payer: Self-pay | Admitting: *Deleted

## 2020-01-26 NOTE — Progress Notes (Signed)
Patient ID: Carol Walls, female   DOB: 14-Dec-1980, 39 y.o.   MRN: [id number] Patient enrolled for [employer] to ship a 30 day cardiac event monitor to her home.

## 2020-01-31 ENCOUNTER — Encounter (INDEPENDENT_AMBULATORY_CARE_PROVIDER_SITE_OTHER): Payer: 59

## 2020-01-31 DIAGNOSIS — R002 Palpitations: Secondary | ICD-10-CM | POA: Diagnosis not present

## 2020-02-01 ENCOUNTER — Other Ambulatory Visit: Payer: Self-pay

## 2020-02-25 ENCOUNTER — Other Ambulatory Visit: Payer: Self-pay

## 2020-02-25 ENCOUNTER — Ambulatory Visit (HOSPITAL_COMMUNITY): Payer: Self-pay | Attending: Cardiology

## 2020-02-25 DIAGNOSIS — R079 Chest pain, unspecified: Secondary | ICD-10-CM

## 2020-02-25 DIAGNOSIS — R002 Palpitations: Secondary | ICD-10-CM

## 2020-02-25 LAB — ECHOCARDIOGRAM COMPLETE
Area-P 1/2: 5.13 cm2
P 1/2 time: 295 msec
S' Lateral: 2.9 cm

## 2020-03-07 ENCOUNTER — Ambulatory Visit: Payer: 59 | Admitting: Nurse Practitioner

## 2020-03-14 ENCOUNTER — Telehealth: Payer: Self-pay

## 2020-03-14 NOTE — Telephone Encounter (Signed)
Called patient via [employer], [place]  The patient did not answer, message was left to return call to the office.  Wilma Flavin, RN 03/14/2020 2:15 PM

## 2020-03-14 NOTE — Telephone Encounter (Signed)
-----   Message from Freada Bergeron, MD sent at 03/14/2020  2:07 PM EST ----- Cardiac monitor shows no significant arrhythmias. This is great news! We will see her at her follow-up appointment.

## 2020-04-24 NOTE — Progress Notes (Deleted)
Cardiology Office Note:    Date:  04/24/2020   ID:  Carol Walls, DOB Mar 16, 1980, MRN [id number]  PCP:  Patient, No Pcp Per   [facility]  Cardiologist:  Freada Bergeron, MD *** Advanced Practice Provider:  No care team member to display Electrophysiologist:  None    Referring MD: No ref. provider found     History of Present Illness:    Carol Walls is a 40 y.o. female with a hx of palpitations who returns to clinic for follow-up.  Last seen 01/25/20 where she was having intermittent palpitations at home with associated shortness of breath, chest pain,  and fatigue. Each episode lasted about several minutes before resolving. Cardiac monitor with occasional PVCs, rare PACs but no sustained arrhythmias. TTE shows normal BiV function with mild MR and mild AR. She now returns to clinic for follow-up.   Past Medical History:  Diagnosis Date  . Back pain   . No pertinent past medical history     Past Surgical History:  Procedure Laterality Date  . CESAREAN SECTION    . CESAREAN SECTION  12/06/2011   Procedure: CESAREAN SECTION;  Surgeon: Frederico Hamman, MD;  Location: [facility];  Service: Obstetrics;  Laterality: N/A;  Repeat Cesarean Section with birth of baby A Boy @ 2125, Baby B Girl  @ 2127    Current Medications: No outpatient medications have been marked as taking for the 04/26/20 encounter (Appointment) with Freada Bergeron, MD.     Allergies:   Patient has no known allergies.   Social History   Socioeconomic History  . Marital status: Married    Spouse name: Not on file  . Number of children: Not on file  . Years of education: Not on file  . Highest education level: Not on file  Occupational History  . Not on file  Tobacco Use  . Smoking status: Never Smoker  . Smokeless tobacco: Never Used  Substance and Sexual Activity  . Alcohol use: Never  . Drug use: Never  . Sexual activity: Yes    Birth control/protection: None   Other Topics Concern  . Not on file  Social History Narrative  . Not on file   Social Determinants of Health   Financial Resource Strain: Not on file  Food Insecurity: Not on file  Transportation Needs: Not on file  Physical Activity: Not on file  Stress: Not on file  Social Connections: Not on file     Family History: The patient's ***family history includes Heart attack (age of onset: 19) in her brother.  ROS:   Please see the history of present illness.    *** All other systems reviewed and are negative.  EKGs/Labs/Other Studies Reviewed:    The following studies were reviewed today: Cardiac Monitor 02/01/20:  Patient's monitoring period was 01/31/20-02/29/20  The predominant rhythm ws NSR with average HR 91bpm; ranging from 65bpm-162bpm  There were occasional PVCs (30,944; 1%), rare PACs (<1%, 1973)  There was no atrial fibrillation, SVT, VT or significant pauses  Overall, no significant arrhythmias detected on cardiac monitor  TTE 02/25/20: 1. Left ventricular ejection fraction, by estimation, is 60 to 65%. The  left ventricle has normal function. The left ventricle has no regional  wall motion abnormalities. Left ventricular diastolic parameters are  consistent with Grade I diastolic  dysfunction (impaired relaxation). The average left ventricular global  longitudinal strain is -22.5 %. The global longitudinal strain is normal.  2. Right  ventricular systolic function is normal. The right ventricular  size is normal.  3. The mitral valve is normal in structure. Mild mitral valve  regurgitation. No evidence of mitral stenosis.  4. The aortic valve is tricuspid. Aortic valve regurgitation is mild. No  aortic stenosis is present. Aortic regurgitation PHT measures 295 msec.  5. The inferior vena cava is normal in size with greater than 50%  respiratory variability, suggesting right atrial pressure of 3 mmHg.   EKG:  EKG is *** ordered today.  The ekg  ordered today demonstrates ***  Recent Labs: 12/25/2019: ALT 14; BUN 14; Creatinine, Ser 0.61; Hemoglobin 12.8; Platelets 134; Potassium 4.2; Sodium 139; TSH 2.870  Recent Lipid Panel    Component Value Date/Time   CHOL 218 (H) 04/20/2009 2142   TRIG 100 04/20/2009 2142   HDL 64 04/20/2009 2142   CHOLHDL 3.4 Ratio 04/20/2009 2142   VLDL 20 04/20/2009 2142   LDLCALC 134 (H) 04/20/2009 2142     Risk Assessment/Calculations:   {Does this patient have ATRIAL FIBRILLATION?:[phone]}   Physical Exam:    VS:  There were no vitals taken for this visit.    Wt Readings from Last 3 Encounters:  01/25/20 179 lb (81.2 kg)  12/25/19 176 lb 11.2 oz (80.2 kg)  08/10/19 175 lb 12.8 oz (79.7 kg)     GEN: *** Well nourished, well developed in no acute distress HEENT: Normal NECK: No JVD; No carotid bruits LYMPHATICS: No lymphadenopathy CARDIAC: ***RRR, no murmurs, rubs, gallops RESPIRATORY:  Clear to auscultation without rales, wheezing or rhonchi  ABDOMEN: Soft, non-tender, non-distended MUSCULOSKELETAL:  No edema; No deformity  SKIN: Warm and dry NEUROLOGIC:  Alert and oriented x 3 PSYCHIATRIC:  Normal affect   ASSESSMENT:    No diagnosis found. PLAN:    In order of problems listed above:  #Palpitations: #Family history of premature CAD Patient with intermittent episodes of palpitations with associated chest discomfort, dizziness and shortness of breath over the past several months. Each episode lasts about 7minutes and resolved with resting or laying down. No syncope. No known personal history of CAD but has very strong family history of early CAD (brother) and cardiomypathies (brother and sister). Cardiac monitor with occasional (1%) PVCs but no significant arrhythmias. TTE with normal BiV function, mild AR, mild MR. Currrently *** -No significant arrhythmias; occasional PVCs -TTE with normal EF, mild AR and MR  {Are you ordering a CV Procedure (e.g. stress test, cath,  DCCV, TEE, etc)?   Press F2        :[id number]}    Medication Adjustments/Labs and Tests Ordered: Current medicines are reviewed at length with the patient today.  Concerns regarding medicines are outlined above.  No orders of the defined types were placed in this encounter.  No orders of the defined types were placed in this encounter.   There are no Patient Instructions on file for this visit.   Signed, Freada Bergeron, MD  04/24/2020 5:30 PM    [place]

## 2020-04-26 ENCOUNTER — Other Ambulatory Visit: Payer: Medicaid Other

## 2020-04-26 ENCOUNTER — Ambulatory Visit: Payer: Self-pay | Admitting: Cardiology

## 2020-11-12 ENCOUNTER — Inpatient Hospital Stay (HOSPITAL_COMMUNITY): Payer: Medicaid Other

## 2020-11-12 ENCOUNTER — Inpatient Hospital Stay (HOSPITAL_COMMUNITY)
Admission: AD | Admit: 2020-11-12 | Discharge: 2020-11-12 | Disposition: A | Discharge status: Home / Self Care | Payer: Medicaid Other | Source: Ambulatory Visit | Attending: Obstetrics and Gynecology | Admitting: Obstetrics and Gynecology

## 2020-11-12 ENCOUNTER — Encounter (HOSPITAL_COMMUNITY): Payer: Self-pay | Admitting: Obstetrics and Gynecology

## 2020-11-12 ENCOUNTER — Ambulatory Visit (HOSPITAL_COMMUNITY)
Admission: EM | Admit: 2020-11-12 | Discharge: 2020-11-12 | Payer: 59 | Attending: Internal Medicine | Admitting: Internal Medicine

## 2020-11-12 ENCOUNTER — Other Ambulatory Visit: Payer: Self-pay

## 2020-11-12 DIAGNOSIS — Z3A08 8 weeks gestation of pregnancy: Secondary | ICD-10-CM | POA: Insufficient documentation

## 2020-11-12 DIAGNOSIS — R109 Unspecified abdominal pain: Secondary | ICD-10-CM

## 2020-11-12 DIAGNOSIS — O3411 Maternal care for benign tumor of corpus uteri, first trimester: Secondary | ICD-10-CM | POA: Insufficient documentation

## 2020-11-12 DIAGNOSIS — Z3491 Encounter for supervision of normal pregnancy, unspecified, first trimester: Secondary | ICD-10-CM

## 2020-11-12 DIAGNOSIS — O26891 Other specified pregnancy related conditions, first trimester: Secondary | ICD-10-CM

## 2020-11-12 DIAGNOSIS — D259 Leiomyoma of uterus, unspecified: Secondary | ICD-10-CM | POA: Diagnosis not present

## 2020-11-12 DIAGNOSIS — H9203 Otalgia, bilateral: Secondary | ICD-10-CM

## 2020-11-12 DIAGNOSIS — R1032 Left lower quadrant pain: Secondary | ICD-10-CM | POA: Diagnosis present

## 2020-11-12 DIAGNOSIS — Z3A00 Weeks of gestation of pregnancy not specified: Secondary | ICD-10-CM

## 2020-11-12 LAB — CBC
HCT: 34.2 % — ABNORMAL LOW (ref 36.0–46.0)
Hemoglobin: 11.9 g/dL — ABNORMAL LOW (ref 12.0–15.0)
MCH: 29.4 pg (ref 26.0–34.0)
MCHC: 34.8 g/dL (ref 30.0–36.0)
MCV: 84.4 fL (ref 80.0–100.0)
Platelets: 171 10*3/uL (ref 150–400)
RBC: 4.05 MIL/uL (ref 3.87–5.11)
RDW: 11.9 % (ref 11.5–15.5)
WBC: 4.3 10*3/uL (ref 4.0–10.5)
nRBC: 0 % (ref 0.0–0.2)

## 2020-11-12 LAB — POCT URINALYSIS DIPSTICK, ED / UC
Bilirubin Urine: NEGATIVE
Glucose, UA: NEGATIVE mg/dL
Nitrite: NEGATIVE
Protein, ur: NEGATIVE mg/dL
Specific Gravity, Urine: >=1.03 (ref 1.005–1.030)
Urobilinogen, UA: 0.2 mg/dL (ref 0.0–1.0)
pH: 5.5 (ref 5.0–8.0)

## 2020-11-12 LAB — URINALYSIS, ROUTINE W REFLEX MICROSCOPIC
Bilirubin Urine: NEGATIVE
Glucose, UA: NEGATIVE mg/dL
Ketones, ur: NEGATIVE mg/dL
Nitrite: NEGATIVE
Protein, ur: NEGATIVE mg/dL
Specific Gravity, Urine: 1.029 (ref 1.005–1.030)
pH: 5 (ref 5.0–8.0)

## 2020-11-12 LAB — POC URINE PREG, ED: Preg Test, Ur: POSITIVE — AB

## 2020-11-12 LAB — HCG, QUANTITATIVE, PREGNANCY: hCG, Beta Chain, Quant, S: 118482 m[IU]/mL — ABNORMAL HIGH (ref <5)

## 2020-11-12 MED ORDER — PREPLUS 27-1 MG PO TABS: 1.0000 | ORAL_TABLET | Freq: Every day | ORAL | 13 refills | Status: DC

## 2020-11-12 NOTE — Discharge Instructions (Addendum)
Please go to the maternity assessment unit for further evaluation

## 2020-11-12 NOTE — MAU Provider Note (Signed)
History     CSN: [id number]  Arrival date and time: 11/12/20 1707   Event Date/Time   First Provider Initiated Contact with Patient 11/12/20 1727      Chief Complaint  Patient presents with   Abdominal Pain   HPI Carol Walls is a 40 y.o. [id number] at [redacted]w[redacted]d who presents with abdominal pain. Symptoms started 4 days ago. Reports pain is intermittent in the left lower quadrant and radiates to her mid lower abdomen. Rates pain 5/10. Hasn't treated symptoms. Nothing makes better or worse. Denies n/v/d, fever, dysuria, vaginal bleeding, or vaginal discharge. Last BM was yesterday.   OB History     Gravida  7   Para  5   Term  4   Preterm  1   AB  1   Living  6      SAB  1   IAB  0   Ectopic  0   Multiple  1   Live Births  2           Past Medical History:  Diagnosis Date   Back pain     Past Surgical History:  Procedure Laterality Date   CESAREAN SECTION     CESAREAN SECTION  12/06/2011   Procedure: CESAREAN SECTION;  Surgeon: Frederico Hamman, MD;  Location: [facility];  Service: Obstetrics;  Laterality: N/A;  Repeat Cesarean Section with birth of baby A Boy @ 2125, Baby B Girl  @ 2127    Family History  Problem Relation Age of Onset   Heart attack Brother 74    Social History   Tobacco Use   Smoking status: Never   Smokeless tobacco: Never  Substance Use Topics   Alcohol use: Never   Drug use: Never    Allergies: No Known Allergies  Medications Prior to Admission  Medication Sig Dispense Refill Last Dose   acetaminophen (TYLENOL) 500 MG tablet Take 1 tablet (500 mg total) by mouth every 6 (six) hours as needed. 30 tablet 0    ibuprofen (ADVIL) 600 MG tablet Take 1 tablet (600 mg total) by mouth every 6 (six) hours as needed. 30 tablet 0    omeprazole (PRILOSEC) 20 MG capsule Take 1 capsule (20 mg total) by mouth daily. 30 capsule 0    polyethylene glycol (MIRALAX) 17 g packet Take 17 g by mouth daily as needed for severe constipation. 14  each 0     Review of Systems  Constitutional: Negative.   Gastrointestinal:  Positive for abdominal pain. Negative for constipation, diarrhea, nausea and vomiting.  Genitourinary: Negative.   Physical Exam   Blood pressure 120/74, pulse 76, temperature 98.1 F (36.7 C), temperature source Oral, resp. rate 16, last menstrual period 09/17/2020, SpO2 100 %, currently breastfeeding.  Physical Exam Vitals and nursing note reviewed.  Constitutional:      Appearance: She is well-developed. She is not ill-appearing.  HENT:     Head: Normocephalic and atraumatic.  Eyes:     General: No scleral icterus. Pulmonary:     Effort: Pulmonary effort is normal. No respiratory distress.  Abdominal:     General: Abdomen is flat.     Palpations: Abdomen is soft.     Tenderness: There is no abdominal tenderness.  Skin:    General: Skin is warm and dry.  Neurological:     General: No focal deficit present.     Mental Status: She is alert.  Psychiatric:        Mood and  Affect: Mood normal.        Behavior: Behavior normal.    MAU Course  Procedures Results for orders placed or performed during the hospital encounter of 11/12/20 (from the past 24 hour(s))  Urinalysis, Routine w reflex microscopic     Status: Abnormal   Collection Time: 11/12/20  5:45 PM  Result Value Ref Range   Color, Urine YELLOW YELLOW   APPearance HAZY (A) CLEAR   Specific Gravity, Urine 1.029 1.005 - 1.030   pH 5.0 5.0 - 8.0   Glucose, UA NEGATIVE NEGATIVE mg/dL   Hgb urine dipstick SMALL (A) NEGATIVE   Bilirubin Urine NEGATIVE NEGATIVE   Ketones, ur NEGATIVE NEGATIVE mg/dL   Protein, ur NEGATIVE NEGATIVE mg/dL   Nitrite NEGATIVE NEGATIVE   Leukocytes,Ua MODERATE (A) NEGATIVE   RBC / HPF 0-5 0 - 5 RBC/hpf   WBC, UA 11-20 0 - 5 WBC/hpf   Bacteria, UA FEW (A) NONE SEEN   Squamous Epithelial / LPF 11-20 0 - 5   Mucus PRESENT   CBC     Status: Abnormal   Collection Time: 11/12/20  6:22 PM  Result Value Ref Range    WBC 4.3 4.0 - 10.5 K/uL   RBC 4.05 3.87 - 5.11 MIL/uL   Hemoglobin 11.9 (L) 12.0 - 15.0 g/dL   HCT 34.2 (L) 36.0 - 46.0 %   MCV 84.4 80.0 - 100.0 fL   MCH 29.4 26.0 - 34.0 pg   MCHC 34.8 30.0 - 36.0 g/dL   RDW 11.9 11.5 - 15.5 %   Platelets 171 150 - 400 K/uL   nRBC 0.0 0.0 - 0.2 %  hCG, quantitative, pregnancy     Status: Abnormal   Collection Time: 11/12/20  6:22 PM  Result Value Ref Range   hCG, Beta Chain, Quant, S 118,482 (H) <5 mIU/mL   US OB Comp Less 14 Wks  Result Date: 11/12/2020 CLINICAL DATA:  Pelvic pain EXAM: OBSTETRIC <14 WK ULTRASOUND TECHNIQUE: Transabdominal ultrasound was performed for evaluation of the gestation as well as the maternal uterus and adnexal regions. COMPARISON:  None. FINDINGS: Intrauterine gestational sac: Present Yolk sac:  Present Embryo:  Present Cardiac Activity: Present Heart Rate: 160 bpm CRL:   21.3 mm   8 w 5 d                  [place] EDC: 06/19/2021 Subchorionic hemorrhage:  None visualized. Maternal uterus/adnexae: Ovaries are within normal limits. Corpus luteum cyst is noted within the right ovary. Multiple uterine fibroids are seen. The largest of these measures 4.5 cm on the left. IMPRESSION: Single live intrauterine gestation at 8 weeks 5 days. Uterine fibroids as described. Electronically Signed   By: Inez Catalina M.D.   On: 11/12/2020 19:17    MDM +UPT UA, CBC, ABO/Rh, quant hCG, and [place] today to rule out ectopic pregnancy which can be life threatening.   Ultrasound shows live IUP measuring [redacted]w[redacted]d. Uterine fibroid, 4.5 cm, seen in left side of uterus.   Reviewed results with patient via video Arabic interpreter. Pain likely due to fibroid. Patient reassured & has no further questions.    Assessment and Plan   1. Uterine fibroids affecting pregnancy in first trimester   2. Abdominal pain during pregnancy in first trimester   3. Normal IUP (intrauterine pregnancy) on prenatal ultrasound, first trimester   4. [redacted] weeks gestation of pregnancy     -reviewed reasons to return to MAU -start prenatal care -rx PNV per patient  request  Jorje Guild 11/12/2020, 7:38 PM

## 2020-11-12 NOTE — ED Triage Notes (Signed)
Pt reports a positive pregnancy test at home . Pt also has Lt sided pain with lower Abd pain.

## 2020-11-12 NOTE — ED Notes (Signed)
Charge nurse at MAU was called and informed of incoming arrival of patient.

## 2020-11-12 NOTE — ED Notes (Signed)
Patient is being discharged from the Urgent Care and sent to the Emergency Department via personal vehicle with spouse . Per Dr Lanny Cramp, patient is in need of higher level of care due to first trimester pregnancy abdominal pain. Patient is aware and verbalizes understanding of plan of care.  Vitals:   11/12/20 1554  BP: 115/68  Pulse: 88  Resp: 18  Temp: 99.4 F (37.4 C)  SpO2: 96%

## 2020-11-12 NOTE — MAU Note (Signed)
Pt reports to mau with c/o lower abd cramping for the past 4 days.  Denies vag bleeding or dc.

## 2020-11-13 NOTE — ED Provider Notes (Signed)
[facility]    CSN: [id number] Arrival date & time: 11/12/20  1504      History   Chief Complaint Chief Complaint  Patient presents with   Possible Pregnancy    HPI Carol Walls is a 40 y.o. female comes to the urgent care for lower abdominal pain.  Symptoms started a few days ago and has been persistent.  She did a home pregnancy test which was positive.  She denies any spotting or bleeding.  No nausea or vomiting.  Abdominal pain is dull, constant with no known aggravating or relieving factors..  Patient complains of right ear pain of a few days duration.  She denies cleaning the ears frequently.  No ear discharge.  No hearing loss or dizziness.  HPI  Past Medical History:  Diagnosis Date   Back pain     Patient Active Problem List   Diagnosis Date Noted   Syncope 12/25/2019   Palpitation 12/25/2019   SOB (shortness of breath) 12/25/2019   Fatigue 12/25/2019   Left flank pain 09/05/2015   Low back pain 06/25/2014   LLQ pain 06/25/2014    Past Surgical History:  Procedure Laterality Date   CESAREAN SECTION     CESAREAN SECTION  12/06/2011   Procedure: CESAREAN SECTION;  Surgeon: Frederico Hamman, MD;  Location: [facility];  Service: Obstetrics;  Laterality: N/A;  Repeat Cesarean Section with birth of baby A Boy @ 2125, Baby B Girl  @ 2127    OB History     Gravida  7   Para  5   Term  4   Preterm  1   AB  1   Living  6      SAB  1   IAB  0   Ectopic  0   Multiple  1   Live Births  2            Home Medications    Prior to Admission medications   Medication Sig Start Date End Date Taking? Authorizing Provider  acetaminophen (TYLENOL) 500 MG tablet Take 1 tablet (500 mg total) by mouth every 6 (six) hours as needed. 12/18/19   Zigmund Gottron, NP  omeprazole (PRILOSEC) 20 MG capsule Take 1 capsule (20 mg total) by mouth daily. 12/18/19   Zigmund Gottron, NP  polyethylene glycol (MIRALAX) 17 g packet Take 17 g by mouth daily  as needed for severe constipation. 05/07/19   LampteyMyrene Galas, MD  Prenatal Vit-Fe Fumarate-FA (PREPLUS) 27-1 MG TABS Take 1 tablet by mouth daily. 11/12/20   Jorje Guild, NP    Family History Family History  Problem Relation Age of Onset   Heart attack Brother 95    Social History Social History   Tobacco Use   Smoking status: Never   Smokeless tobacco: Never  Substance Use Topics   Alcohol use: Never   Drug use: Never     Allergies   Patient has no known allergies.   Review of Systems Review of Systems  HENT: Negative.    Respiratory: Negative.    Gastrointestinal:  Positive for abdominal pain. Negative for nausea and vomiting.    Physical Exam Triage Vital Signs ED Triage Vitals  Enc Vitals Group     BP 11/12/20 1554 115/68     Pulse Rate 11/12/20 1554 88     Resp 11/12/20 1554 18     Temp 11/12/20 1554 99.4 F (37.4 C)     Temp src --  SpO2 11/12/20 1554 96 %     Weight --      Height --      Head Circumference --      Peak Flow --      Pain Score 11/12/20 1556 3     Pain Loc --      Pain Edu? --      Excl. in [place]? --    No data found.  Updated Vital Signs BP 115/68   Pulse 88   Temp 99.4 F (37.4 C)   Resp 18   LMP 09/17/2020   SpO2 96%   Visual Acuity Right Eye Distance:   Left Eye Distance:   Bilateral Distance:    Right Eye Near:   Left Eye Near:    Bilateral Near:     Physical Exam Vitals and nursing note reviewed.  Constitutional:      Appearance: Normal appearance.  HENT:     Right Ear: Tympanic membrane normal.     Left Ear: Tympanic membrane normal.     Ears:     Comments: Cerumen in the right ear. Cardiovascular:     Rate and Rhythm: Normal rate and regular rhythm.  Pulmonary:     Effort: Pulmonary effort is normal.     Breath sounds: Normal breath sounds.  Abdominal:     General: Bowel sounds are normal.     Palpations: Abdomen is soft.     Tenderness: There is no abdominal tenderness. There is no guarding.   Neurological:     Mental Status: She is alert.     UC Treatments / Results  Labs (all labs ordered are listed, but only abnormal results are displayed) Labs Reviewed  POCT URINALYSIS DIPSTICK, ED / UC - Abnormal; Notable for the following components:      Result Value   Ketones, ur TRACE (*)    Hgb urine dipstick TRACE (*)    Leukocytes,Ua SMALL (*)    All other components within normal limits  POC URINE PREG, ED - Abnormal; Notable for the following components:   Preg Test, Ur POSITIVE (*)    All other components within normal limits    EKG   Radiology   Procedures Procedures (including critical care time)  Medications Ordered in UC Medications - No data to display  Initial Impression / Assessment and Plan / UC Course  I have reviewed the triage vital signs and the nursing notes.  Pertinent labs & imaging results that were available during my care of the patient were reviewed by me and considered in my medical decision making (see chart for details).     1.  Abdominal pain during the first trimester of pregnancy: Urine pregnancy test is positive. Patient is advised to go to the MAU for further evaluation given the positive urine pregnancy test and abdominal pain. Patient agrees with the plan of care  2.  Ear pain secondary to cerumen impaction: Patient is advised to use over-the-counter Debrox. Final Clinical Impressions(s) / UC Diagnoses   Final diagnoses:  Abdominal pain during pregnancy in first trimester  Otalgia of both ears     Discharge Instructions      Please go to the maternity assessment unit for further evaluation   ED Prescriptions   None    PDMP not reviewed this encounter.   Chase Picket, MD 11/13/20 [id number]

## 2020-11-14 LAB — CULTURE, OB URINE
Culture: NO GROWTH
Special Requests: NORMAL

## 2020-12-19 ENCOUNTER — Ambulatory Visit (INDEPENDENT_AMBULATORY_CARE_PROVIDER_SITE_OTHER): Payer: Self-pay

## 2020-12-19 DIAGNOSIS — O09219 Supervision of pregnancy with history of pre-term labor, unspecified trimester: Secondary | ICD-10-CM

## 2020-12-19 DIAGNOSIS — O0990 Supervision of high risk pregnancy, unspecified, unspecified trimester: Secondary | ICD-10-CM | POA: Insufficient documentation

## 2020-12-19 DIAGNOSIS — O09529 Supervision of elderly multigravida, unspecified trimester: Secondary | ICD-10-CM

## 2020-12-19 DIAGNOSIS — O0940 Supervision of pregnancy with grand multiparity, unspecified trimester: Secondary | ICD-10-CM

## 2020-12-19 DIAGNOSIS — Z3A00 Weeks of gestation of pregnancy not specified: Secondary | ICD-10-CM

## 2020-12-19 NOTE — Progress Notes (Signed)
New OB Intake  I connected with  Carol Walls on 12/19/20 at  2:00 PM EST by telephone Video Visit and verified that I am speaking with the correct person using two identifiers. Nurse is located at [facility] and pt is located at home.  I discussed the limitations, risks, security and privacy concerns of performing an evaluation and management service by telephone and the availability of in person appointments. I also discussed with the patient that there may be a patient responsible charge related to this service. The patient expressed understanding and agreed to proceed.  I explained I am completing New OB Intake today. We discussed her EDD of 06/24/21 that is based on LMP of 09/17/20. Pt is G7/P6. I reviewed her allergies, medications, Medical/Surgical/OB history, and appropriate screenings. I informed her of [facility] services. Based on history, this is  pregnancy is complicated by multigravida and AMA. Pt also has history of preterm delivery with multiples in previous pregnancy  .   Patient Active Problem List   Diagnosis Date Noted   Syncope 12/25/2019   Palpitation 12/25/2019   SOB (shortness of breath) 12/25/2019   Fatigue 12/25/2019   Left flank pain 09/05/2015   Low back pain 06/25/2014   LLQ pain 06/25/2014    Concerns addressed today  Delivery Plans:  Plans to deliver at [facility].   MyChart/Babyscripts MyChart access verified. I explained pt will have some visits in office and some virtually. Babyscripts instructions given and order placed. Patient verifies receipt of registration text/e-mail. Account successfully created and app downloaded.  Blood Pressure Cuff  Blood pressure cuff ordered for patient to pick-up from [employer]. Explained after first prenatal appt pt will check weekly and document in 60.  Weight scale: Patient    have weight scale. Weight scale ordered for patient to pick up form [facility].   Anatomy US Explained first scheduled [place] will be  around 19 weeks. Anatomy US will be scheduled at initial ob visit with provider.   Labs Discussed Johnsie Cancel genetic screening with patient. Would like both Panorama and Horizon drawn at new OB visit. Routine prenatal labs needed.  Covid Vaccine Patient has not covid vaccine.   Mother/ Baby Dyad Candidate?    If yes, offer as possibility  Informed patient of [facility] website  and placed link in her AVS.   Social Determinants of Health Food Insecurity: Patient denies food insecurity. WIC Referral: Patient is interested in referral to [facility].  Transportation: Patient denies transportation needs. Childcare: Discussed no children allowed at ultrasound appointments. Offered childcare services; patient declines childcare services at this time.  Send link to Pregnancy Navigators   Placed OB Box on problem list and updated  First visit review I reviewed new OB appt with pt. I explained she will have a pelvic exam, ob bloodwork with genetic screening, and PAP smear. Explained pt will be seen by Vivien Rota, MD at first visit; encounter routed to appropriate provider. Explained that patient will be seen by pregnancy navigator following visit with provider. [facility] information placed in AVS.   Berneice Heinrich, CMA 12/19/2020  2:09 PM

## 2020-12-27 ENCOUNTER — Ambulatory Visit (INDEPENDENT_AMBULATORY_CARE_PROVIDER_SITE_OTHER): Payer: Medicaid Other | Admitting: Obstetrics and Gynecology

## 2020-12-27 ENCOUNTER — Encounter: Payer: Self-pay | Admitting: Obstetrics and Gynecology

## 2020-12-27 ENCOUNTER — Other Ambulatory Visit (HOSPITAL_COMMUNITY)
Admission: RE | Admit: 2020-12-27 | Discharge: 2020-12-27 | Disposition: A | Discharge status: Home / Self Care | Payer: Medicaid Other | Source: Ambulatory Visit | Attending: Obstetrics and Gynecology | Admitting: Obstetrics and Gynecology

## 2020-12-27 ENCOUNTER — Other Ambulatory Visit: Payer: Self-pay

## 2020-12-27 VITALS — BP 144/92 | HR 93 | Wt 181.0 lb

## 2020-12-27 DIAGNOSIS — O09529 Supervision of elderly multigravida, unspecified trimester: Secondary | ICD-10-CM | POA: Insufficient documentation

## 2020-12-27 DIAGNOSIS — Z789 Other specified health status: Secondary | ICD-10-CM

## 2020-12-27 DIAGNOSIS — R030 Elevated blood-pressure reading, without diagnosis of hypertension: Secondary | ICD-10-CM

## 2020-12-27 DIAGNOSIS — O0990 Supervision of high risk pregnancy, unspecified, unspecified trimester: Secondary | ICD-10-CM

## 2020-12-27 DIAGNOSIS — Z98891 History of uterine scar from previous surgery: Secondary | ICD-10-CM | POA: Insufficient documentation

## 2020-12-27 DIAGNOSIS — D219 Benign neoplasm of connective and other soft tissue, unspecified: Secondary | ICD-10-CM

## 2020-12-27 DIAGNOSIS — Z3A14 14 weeks gestation of pregnancy: Secondary | ICD-10-CM

## 2020-12-27 DIAGNOSIS — O09522 Supervision of elderly multigravida, second trimester: Secondary | ICD-10-CM

## 2020-12-27 MED ORDER — ASPIRIN EC 81 MG PO TBEC: 81.0000 mg | DELAYED_RELEASE_TABLET | Freq: Every day | ORAL | 2 refills | Status: DC

## 2020-12-27 NOTE — Progress Notes (Signed)
INITIAL PRENATAL VISIT NOTE  Subjective:  Carol Walls is a 40 y.o. [id number] at [redacted]w[redacted]d by LMP being seen today for her initial prenatal visit. She has an obstetric history significant for 3 x SVD and 2 x CS. She has a medical history significant for uterine fibroids. Delivered last pregnancy at 35 weeks due to no fetal movement, (per OP note, patient was in active labor and desired repeat).   Patient reports no complaints.  Contractions: Not present. Vag. Bleeding: None.   . Denies leaking of fluid.    Past Medical History:  Diagnosis Date   Back pain    Fibroids     Past Surgical History:  Procedure Laterality Date   CESAREAN SECTION     CESAREAN SECTION  12/06/2011   Procedure: CESAREAN SECTION;  Surgeon: Frederico Hamman, MD;  Location: [facility];  Service: Obstetrics;  Laterality: N/A;  Repeat Cesarean Section with birth of baby A Boy @ 2125, 65 B Girl  @ 2127    OB History  Gravida Para Term Preterm AB Living  7 5 4 1 1 6   SAB IAB Ectopic Multiple Live Births  1 0 0 1 2    # Outcome Date GA Lbr Len/2nd Weight Sex Delivery Anes PTL Lv  7 Current           6A Preterm 12/06/11 [redacted]w[redacted]d  4 lb 11.5 oz (2.14 kg) M CS-LVertical Spinal  LIV  6B Preterm 12/06/11 [redacted]w[redacted]d  5 lb 5.7 oz (2.43 kg) F CS-LVertical Spinal  LIV  5 Term 2010    F CS-Unspec     4 SAB 2009 [redacted]w[redacted]d         3 Term 2005    M Vag-Spont     2 Term 2002    M Vag-Spont     1 Term 1999    M Vag-Spont       Social History   Socioeconomic History   Marital status: Married    Spouse name: Not on file   Number of children: Not on file   Years of education: Not on file   Highest education level: Not on file  Occupational History   Not on file  Tobacco Use   Smoking status: Never   Smokeless tobacco: Never  Substance and Sexual Activity   Alcohol use: Never   Drug use: Never   Sexual activity: Yes    Birth control/protection: None  Other Topics Concern   Not on file  Social History Narrative   Not on file    Social Determinants of Health   Financial Resource Strain: Not on file  Food Insecurity: Not on file  Transportation Needs: Not on file  Physical Activity: Not on file  Stress: Not on file  Social Connections: Not on file    Family History  Problem Relation Age of Onset   Heart attack Brother 47     Current Outpatient Medications:    aspirin EC 81 MG tablet, Take 1 tablet (81 mg total) by mouth daily. Take after 12 weeks for prevention of preeclampsia later in pregnancy, Disp: 300 tablet, Rfl: 2   Prenatal Vit-Fe Fumarate-FA (PREPLUS) 27-1 MG TABS, Take 1 tablet by mouth daily., Disp: 30 tablet, Rfl: 13   acetaminophen (TYLENOL) 500 MG tablet, Take 1 tablet (500 mg total) by mouth every 6 (six) hours as needed., Disp: 30 tablet, Rfl: 0   omeprazole (PRILOSEC) 20 MG capsule, Take 1 capsule (20 mg total) by mouth daily. (Patient  not taking: Reported on 12/19/2020), Disp: 30 capsule, Rfl: 0   polyethylene glycol (MIRALAX) 17 g packet, Take 17 g by mouth daily as needed for severe constipation. (Patient not taking: Reported on 12/19/2020), Disp: 14 each, Rfl: 0  No Known Allergies  Review of Systems: Negative except for what is mentioned in HPI.  Objective:   Vitals:   12/27/20 1517  BP: (!) 144/92  Pulse: 93  Weight: 181 lb (82.1 kg)    Fetal Status: Fetal Heart Rate (bpm): 160         Physical Exam: BP (!) 144/92   Pulse 93   Wt 181 lb (82.1 kg)   LMP 09/17/2020   BMI 29.21 kg/m  CONSTITUTIONAL: Well-developed, well-nourished female in no acute distress.  NEUROLOGIC: Alert and oriented to person, place, and time. Normal reflexes, muscle tone coordination. No cranial nerve deficit noted. PSYCHIATRIC: Normal mood and affect. Normal behavior. Normal judgment and thought content. SKIN: Skin is warm and dry. No rash noted. Not diaphoretic. No erythema. No pallor. HENT:  Normocephalic, atraumatic, External right and left ear normal. Oropharynx is clear and moist EYES:  Conjunctivae and EOM are normal. Pupils are equal, round, and reactive to light. No scleral icterus.  NECK: Normal range of motion, supple, no masses CARDIOVASCULAR: Normal heart rate noted, regular rhythm RESPIRATORY: Effort and breath sounds normal, no problems with respiration noted BREASTS: symmetric, non-tender, no masses palpable ABDOMEN: Soft, nontender, nondistended, gravid. GU: normal appearing external female genitalia, multiparous normal appearing cervix, scant white discharge in vagina, no lesions noted Bimanual: 14 weeks sized uterus, no adnexal tenderness or palpable lesions noted MUSCULOSKELETAL: Normal range of motion. EXT:  No edema and no tenderness. 2+ distal pulses.   Assessment and Plan:  Pregnancy: [id number] at [redacted]w[redacted]d by LMP, c/w 8 week [place]  1. Supervision of high risk pregnancy, antepartum Reviewed [facility] structure, multiple providers, fellows, medical students, virtual visits, MyChart.  - declines mammogram - declines genetic screening - CBC/D/Plt+RPR+Rh+ABO+RubIgG... - Culture, OB Urine - Cervicovaginal ancillary only( [place]) - Hepatitis C Antibody - HgB A1c - Comprehensive metabolic panel - Protein / creatinine ratio, urine - [place] MFM OB DETAIL +14 WK; Future  2. Multigravida of advanced maternal age in second trimester Start baby asa  3. H/O: C-section X2 Will need repeat  4. Elevated blood pressure reading in office without diagnosis of hypertension 144/92 today no h/o HTN CMP/UPCr today  5. [redacted] weeks gestation of pregnancy  6. Language barrier Arabic interpretor used  7. Fibroid Has some pain, doing okay now  Preterm labor symptoms and general obstetric precautions including but not limited to vaginal bleeding, contractions, leaking of fluid and fetal movement were reviewed in detail with the patient.  Please refer to After Visit Summary for other counseling recommendations.   Return in about 2 weeks  (around 01/10/2021) for high OB, in person.  Sloan Leiter 12/27/2020 4:07 PM

## 2020-12-28 LAB — HCV INTERPRETATION

## 2020-12-28 LAB — CBC/D/PLT+RPR+RH+ABO+RUBIGG...
Antibody Screen: NEGATIVE
Basophils Absolute: 0 10*3/uL (ref 0.0–0.2)
Basos: 0 %
EOS (ABSOLUTE): 0.1 10*3/uL (ref 0.0–0.4)
Eos: 2 %
HCV Ab: <0.1 s/co ratio (ref 0.0–0.9)
HIV Screen 4th Generation wRfx: NONREACTIVE
Hematocrit: 33.5 % — ABNORMAL LOW (ref 34.0–46.6)
Hemoglobin: 11.6 g/dL (ref 11.1–15.9)
Hepatitis B Surface Ag: NEGATIVE
Immature Grans (Abs): 0 10*3/uL (ref 0.0–0.1)
Immature Granulocytes: 0 %
Lymphocytes Absolute: 1.1 10*3/uL (ref 0.7–3.1)
Lymphs: 26 %
MCH: 29.2 pg (ref 26.6–33.0)
MCHC: 34.6 g/dL (ref 31.5–35.7)
MCV: 84 fL (ref 79–97)
Monocytes Absolute: 0.5 10*3/uL (ref 0.1–0.9)
Monocytes: 11 %
Neutrophils Absolute: 2.7 10*3/uL (ref 1.4–7.0)
Neutrophils: 61 %
Platelets: 182 10*3/uL (ref 150–450)
RBC: 3.97 x10E6/uL (ref 3.77–5.28)
RDW: 12.2 % (ref 11.7–15.4)
RPR Ser Ql: NONREACTIVE
Rh Factor: POSITIVE
Rubella Antibodies, IGG: 2.19 index (ref >0.99)
WBC: 4.5 10*3/uL (ref 3.4–10.8)

## 2020-12-28 LAB — COMPREHENSIVE METABOLIC PANEL
ALT: 27 IU/L (ref 0–32)
AST: 20 IU/L (ref 0–40)
Albumin/Globulin Ratio: 1.4 (ref 1.2–2.2)
Albumin: 4 g/dL (ref 3.8–4.8)
Alkaline Phosphatase: 43 IU/L — ABNORMAL LOW (ref 44–121)
BUN/Creatinine Ratio: 16 (ref 9–23)
BUN: 8 mg/dL (ref 6–24)
Bilirubin Total: <0.2 mg/dL (ref 0.0–1.2)
CO2: 18 mmol/L — ABNORMAL LOW (ref 20–29)
Calcium: 9.8 mg/dL (ref 8.7–10.2)
Chloride: 104 mmol/L (ref 96–106)
Creatinine, Ser: 0.51 mg/dL — ABNORMAL LOW (ref 0.57–1.00)
Globulin, Total: 2.9 g/dL (ref 1.5–4.5)
Glucose: 116 mg/dL — ABNORMAL HIGH (ref 70–99)
Potassium: 4 mmol/L (ref 3.5–5.2)
Sodium: 138 mmol/L (ref 134–144)
Total Protein: 6.9 g/dL (ref 6.0–8.5)
eGFR: 119 mL/min/{1.73_m2} (ref >59)

## 2020-12-28 LAB — CERVICOVAGINAL ANCILLARY ONLY
Chlamydia: NEGATIVE
Comment: NEGATIVE
Comment: NORMAL
Neisseria Gonorrhea: NEGATIVE

## 2020-12-28 LAB — HEMOGLOBIN A1C
Est. average glucose Bld gHb Est-mCnc: 111 mg/dL
Hgb A1c MFr Bld: 5.5 % (ref 4.8–5.6)

## 2020-12-28 LAB — HEPATITIS C ANTIBODY: Hep C Virus Ab: <0.1 s/co ratio (ref 0.0–0.9)

## 2020-12-29 LAB — PROTEIN / CREATININE RATIO, URINE
Creatinine, Urine: 120.3 mg/dL
Protein, Ur: 9.7 mg/dL
Protein/Creat Ratio: 81 mg/g creat (ref 0–200)

## 2020-12-29 LAB — CULTURE, OB URINE

## 2020-12-29 LAB — URINE CULTURE, OB REFLEX: Organism ID, Bacteria: NO GROWTH

## 2021-01-19 ENCOUNTER — Other Ambulatory Visit: Payer: Self-pay | Admitting: Cardiology

## 2021-01-19 DIAGNOSIS — R079 Chest pain, unspecified: Secondary | ICD-10-CM

## 2021-01-27 ENCOUNTER — Other Ambulatory Visit: Payer: Self-pay

## 2021-01-27 ENCOUNTER — Ambulatory Visit (INDEPENDENT_AMBULATORY_CARE_PROVIDER_SITE_OTHER): Payer: Medicaid Other | Admitting: Obstetrics and Gynecology

## 2021-01-27 ENCOUNTER — Encounter: Payer: Self-pay | Admitting: Obstetrics and Gynecology

## 2021-01-27 ENCOUNTER — Encounter: Payer: Self-pay | Admitting: Family Medicine

## 2021-01-27 VITALS — BP 108/74 | HR 105 | Wt 184.7 lb

## 2021-01-27 DIAGNOSIS — Z98891 History of uterine scar from previous surgery: Secondary | ICD-10-CM

## 2021-01-27 DIAGNOSIS — O09522 Supervision of elderly multigravida, second trimester: Secondary | ICD-10-CM

## 2021-01-27 DIAGNOSIS — O0990 Supervision of high risk pregnancy, unspecified, unspecified trimester: Secondary | ICD-10-CM

## 2021-01-27 NOTE — Progress Notes (Signed)
° °  PRENATAL VISIT NOTE  Subjective:  Carol Walls is a 40 y.o. [id number] at [redacted]w[redacted]d being seen today for ongoing prenatal care.  She is currently monitored for the following issues for this high-risk pregnancy and has Low back pain; LLQ pain; Left flank pain; Syncope; Palpitation; SOB (shortness of breath); Fatigue; Supervision of high risk pregnancy, antepartum; Advanced maternal age in multigravida; H/O: C-section; and Fibroid on their problem list.  Patient reports no complaints.  Contractions: Not present. Vag. Bleeding: None.  Movement: Absent. Denies leaking of fluid.   The following portions of the patient's history were reviewed and updated as appropriate: allergies, current medications, past family history, past medical history, past social history, past surgical history and problem list.   Objective:   Vitals:   01/27/21 1057  BP: 108/74  Pulse: (!) 105  Weight: 184 lb 11.2 oz (83.8 kg)    Fetal Status: Fetal Heart Rate (bpm): 150   Movement: Absent     General:  Alert, oriented and cooperative. Patient is in no acute distress.  Skin: Skin is warm and dry. No rash noted.   Cardiovascular: Normal heart rate noted  Respiratory: Normal respiratory effort, no problems with respiration noted  Abdomen: Soft, gravid, appropriate for gestational age.  Pain/Pressure: Absent     Pelvic: Cervical exam deferred        Extremities: Normal range of motion.  Edema: None  Mental Status: Normal mood and affect. Normal behavior. Normal judgment and thought content.   Assessment and Plan:  Pregnancy: [id number] at [redacted]w[redacted]d 1. Supervision of high risk pregnancy, antepartum Patient is doing well without complaints Scheduled for anatomy next week Patient declined AFP  2. Multigravida of advanced maternal age in second trimester Continue taking ASA  3. H/O: C-section Will be scheduled for repeat at 39 weeks. Patient is also interested in [facility] and understands that it will be an option if  spontaneous labor  Preterm labor symptoms and general obstetric precautions including but not limited to vaginal bleeding, contractions, leaking of fluid and fetal movement were reviewed in detail with the patient. Please refer to After Visit Summary for other counseling recommendations.   Return in about 4 weeks (around 02/24/2021) for in person, ROB, High risk.  Future Appointments  Date Time Provider [place]  01/30/2021  1:30 PM [facility] NURSE [facility]  01/30/2021  1:45 PM [facility] US5 [facility]    Mora Bellman, MD

## 2021-01-28 IMAGING — DX DG LUMBAR SPINE COMPLETE 4+V
5 series · 5 of 5 positions shown · non-contrast
Comparison: 09/10/2015

CLINICAL DATA: Lower back pain

EXAM:
LUMBAR SPINE - COMPLETE 4+ VIEW

[l-spine ap]
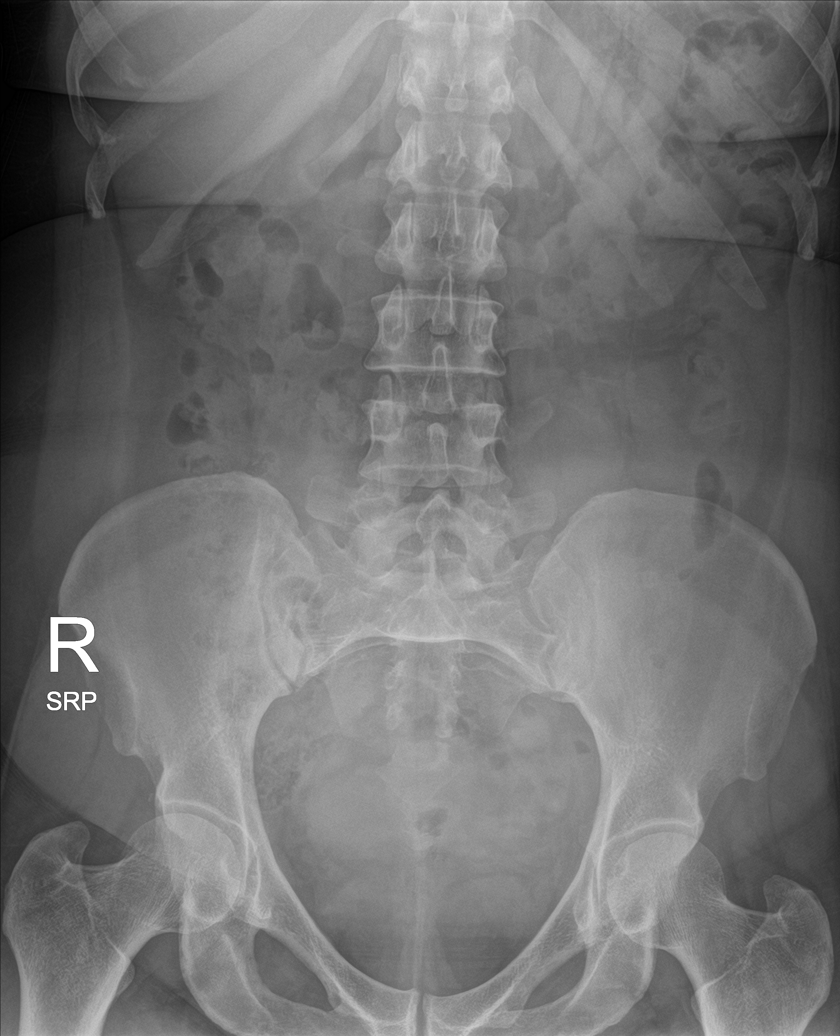

[l-spine obl (1 of 2)]
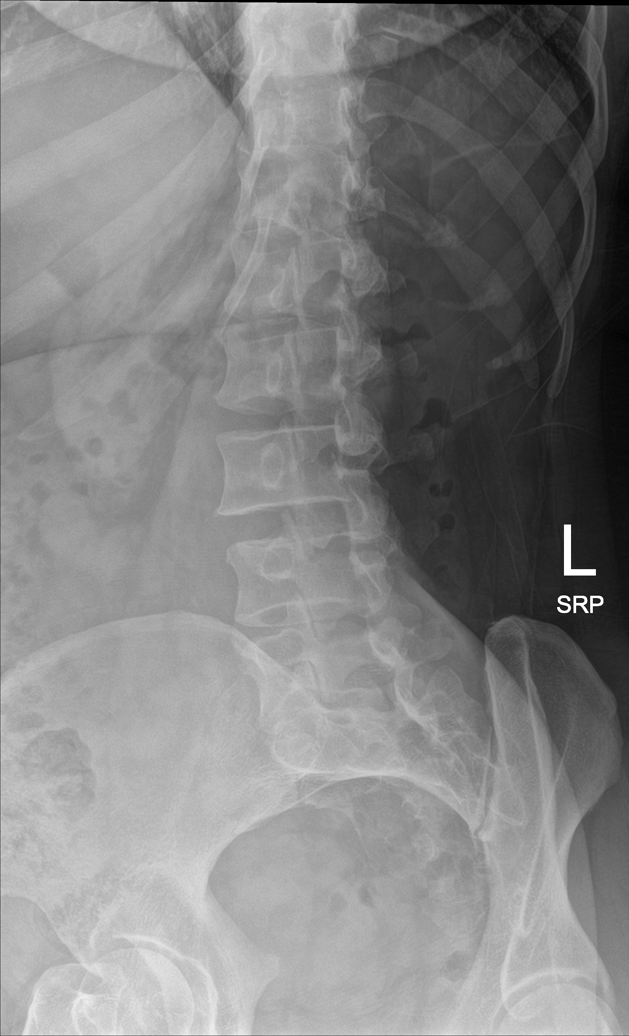

[l-spine obl (2 of 2)]
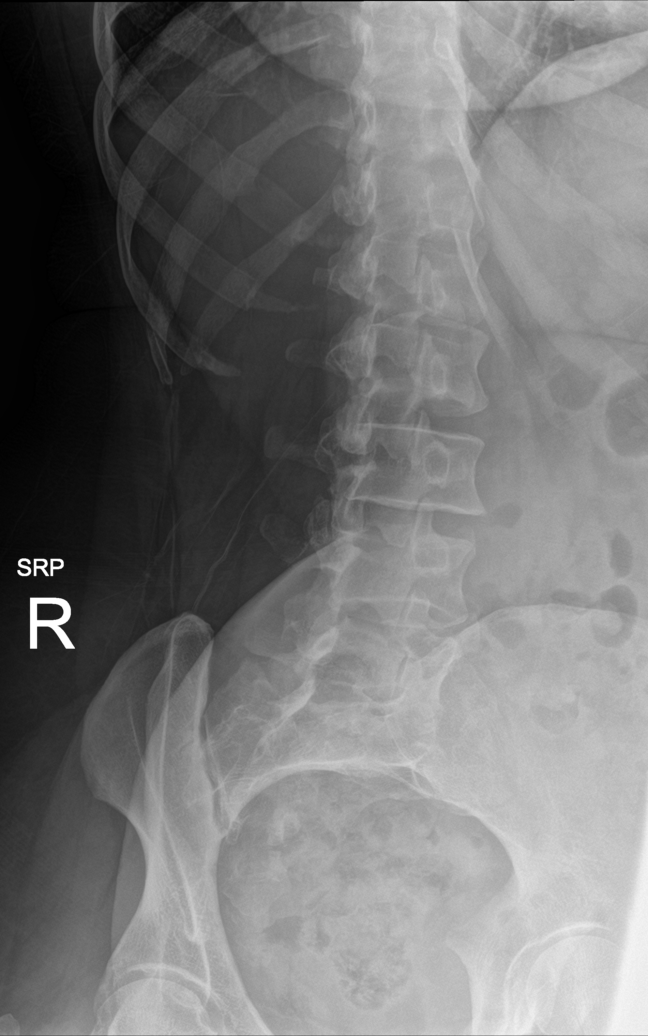

[l-spine lat]
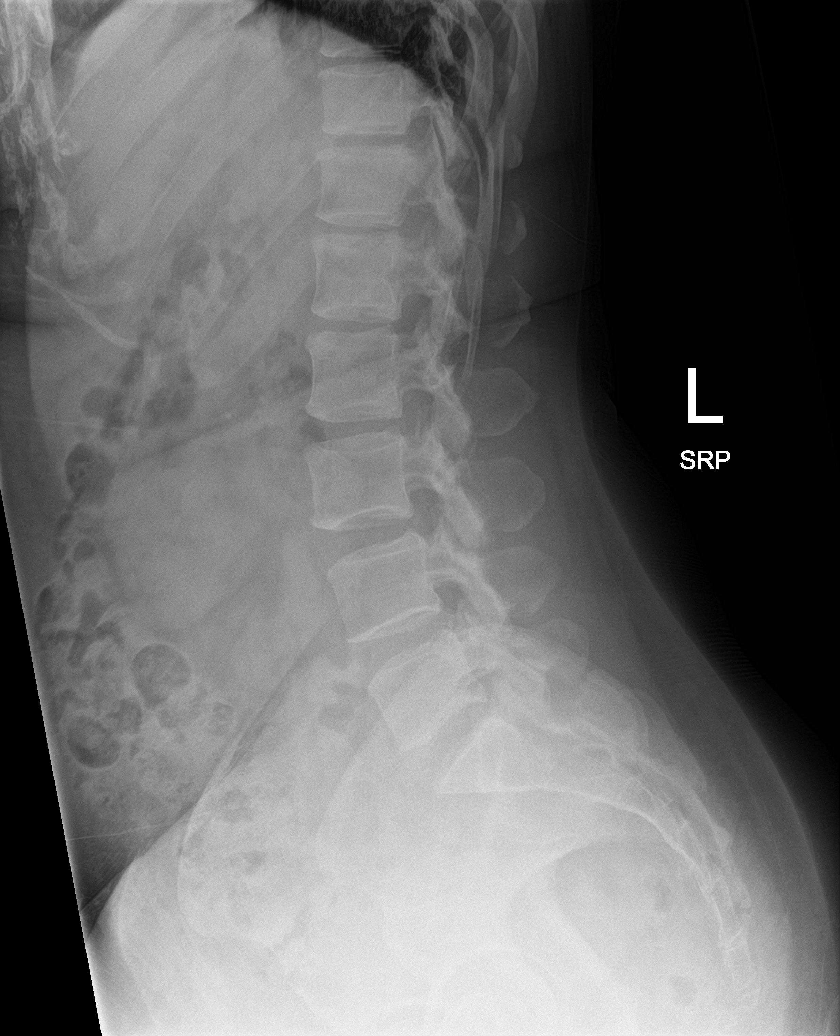

[l-spine spot]
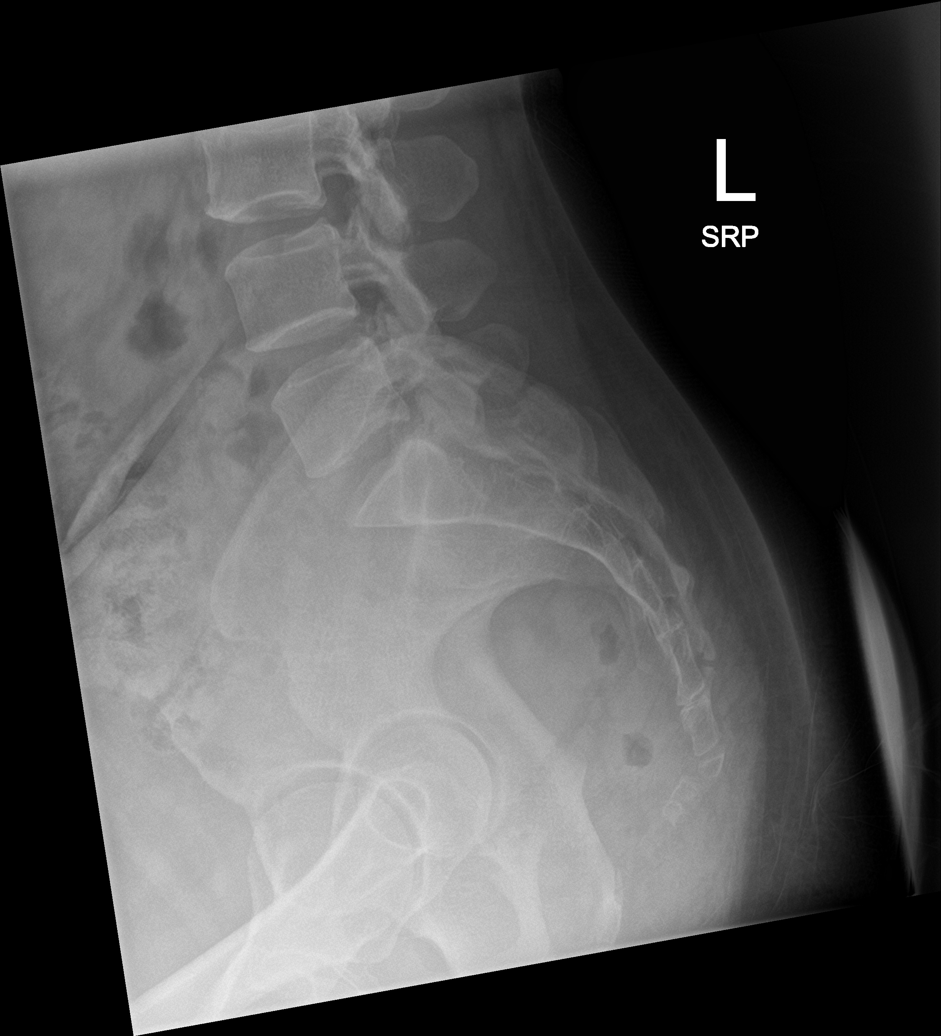

[5 of 5 positions shown; findings below may reference images not displayed]

FINDINGS: Five non-rib bearing lumbar vertebrae.

Osseous mineralization normal.

Vertebral body and disc space heights maintained.

No fracture, subluxation, or bone destruction.

No spondylolysis.

SI joints preserved.
IMPRESSION: Normal exam.

## 2021-01-30 ENCOUNTER — Ambulatory Visit: Payer: Medicaid Other | Admitting: *Deleted

## 2021-01-30 ENCOUNTER — Other Ambulatory Visit: Payer: Self-pay

## 2021-01-30 ENCOUNTER — Ambulatory Visit: Payer: Medicaid Other | Attending: Obstetrics and Gynecology

## 2021-01-30 ENCOUNTER — Encounter: Payer: Self-pay | Admitting: Family Medicine

## 2021-01-30 ENCOUNTER — Other Ambulatory Visit: Payer: Self-pay | Admitting: *Deleted

## 2021-01-30 ENCOUNTER — Encounter: Payer: Self-pay | Admitting: *Deleted

## 2021-01-30 VITALS — BP 120/73 | HR 108

## 2021-01-30 DIAGNOSIS — O0990 Supervision of high risk pregnancy, unspecified, unspecified trimester: Secondary | ICD-10-CM | POA: Diagnosis not present

## 2021-01-30 DIAGNOSIS — O09522 Supervision of elderly multigravida, second trimester: Secondary | ICD-10-CM

## 2021-01-30 DIAGNOSIS — O34219 Maternal care for unspecified type scar from previous cesarean delivery: Secondary | ICD-10-CM

## 2021-02-01 ENCOUNTER — Inpatient Hospital Stay (HOSPITAL_COMMUNITY)
Admission: AD | Admit: 2021-02-01 | Discharge: 2021-02-01 | Disposition: A | Discharge status: Home / Self Care | Payer: Medicaid Other | Attending: Obstetrics and Gynecology | Admitting: Obstetrics and Gynecology

## 2021-02-01 ENCOUNTER — Other Ambulatory Visit: Payer: Self-pay

## 2021-02-01 DIAGNOSIS — Z7982 Long term (current) use of aspirin: Secondary | ICD-10-CM | POA: Insufficient documentation

## 2021-02-01 DIAGNOSIS — Z20822 Contact with and (suspected) exposure to covid-19: Secondary | ICD-10-CM | POA: Insufficient documentation

## 2021-02-01 DIAGNOSIS — O99612 Diseases of the digestive system complicating pregnancy, second trimester: Secondary | ICD-10-CM | POA: Insufficient documentation

## 2021-02-01 DIAGNOSIS — O99512 Diseases of the respiratory system complicating pregnancy, second trimester: Secondary | ICD-10-CM | POA: Insufficient documentation

## 2021-02-01 DIAGNOSIS — O09522 Supervision of elderly multigravida, second trimester: Secondary | ICD-10-CM

## 2021-02-01 DIAGNOSIS — K219 Gastro-esophageal reflux disease without esophagitis: Secondary | ICD-10-CM | POA: Insufficient documentation

## 2021-02-01 DIAGNOSIS — Z3A19 19 weeks gestation of pregnancy: Secondary | ICD-10-CM | POA: Diagnosis not present

## 2021-02-01 DIAGNOSIS — J029 Acute pharyngitis, unspecified: Secondary | ICD-10-CM | POA: Insufficient documentation

## 2021-02-01 LAB — RESP PANEL BY RT-PCR (FLU A&B, COVID) ARPGX2
Influenza A by PCR: NEGATIVE
Influenza B by PCR: NEGATIVE
SARS Coronavirus 2 by RT PCR: NEGATIVE

## 2021-02-01 MED ORDER — ACETAMINOPHEN 325 MG PO TABS: 650.0000 mg | ORAL_TABLET | ORAL | 0 refills | Status: AC | PRN

## 2021-02-01 MED ORDER — OMEPRAZOLE MAGNESIUM 20 MG PO TBEC: 20.0000 mg | DELAYED_RELEASE_TABLET | Freq: Every day | ORAL | 0 refills | Status: DC

## 2021-02-01 MED ORDER — SORE THROAT LOZENGES 15-3.6 MG MT LOZG: 1.0000 | LOZENGE | Freq: Three times a day (TID) | OROMUCOSAL | 0 refills | Status: AC | PRN

## 2021-02-01 MED ORDER — LORATADINE 10 MG PO TABS: 10.0000 mg | ORAL_TABLET | Freq: Every day | ORAL | 0 refills | Status: DC

## 2021-02-01 NOTE — MAU Provider Note (Signed)
History     CSN: [id number]  Arrival date and time: 02/01/21 1152   Event Date/Time   First Provider Initiated Contact with Patient 02/01/21 1223      Chief Complaint  Patient presents with   Headache   Sore Throat   HPI Carol Walls is a 40 y.o. [id number] at [redacted]w[redacted]d who presents to MAU with chief complaint of sore throat. This is a recurrent problem, onset two days ago. She states it is impacting her diet and she is having difficulty tolerating normal food. She denies hoarse voice, SOB, chest pain, weakness, fever or syncope. She has not trialed lozenges or other treatments for sore throat.  Patient also c/o headache, onset two days ago. Pain score is 1/10. She denies aggravating or alleviating factors. She has not taken medication or tried other treatments for this complaint.  Patient also c/o bilateral ear pain. This is a recurrent problem, onset coinciding with onset of sore throat. She states the pain is just inside her meatus. She denies discharge, tinnitus.  Patient reports recurrent acid reflux and requests medication for this problem in conjunction with her discharge from MAU.  She denies all pregnancy-related complaints including abdominal pain, vaginal bleeding, dysuria.  Patient receives care with [facility].  OB History     Gravida  7   Para  5   Term  4   Preterm  1   AB  1   Living  6      SAB  1   IAB  0   Ectopic  0   Multiple  1   Live Births  2           Past Medical History:  Diagnosis Date   Back pain    Fibroids     Past Surgical History:  Procedure Laterality Date   CESAREAN SECTION     CESAREAN SECTION  12/06/2011   Procedure: CESAREAN SECTION;  Surgeon: Frederico Hamman, MD;  Location: [facility];  Service: Obstetrics;  Laterality: N/A;  Repeat Cesarean Section with birth of baby A Boy @ 2125, Baby B Girl  @ 2127    Family History  Problem Relation Age of Onset   Heart attack Brother 83    Social History   Tobacco Use    Smoking status: Never   Smokeless tobacco: Never  Substance Use Topics   Alcohol use: Never   Drug use: Never    Allergies: No Known Allergies  Medications Prior to Admission  Medication Sig Dispense Refill Last Dose   acetaminophen (TYLENOL) 500 MG tablet Take 1 tablet (500 mg total) by mouth every 6 (six) hours as needed. 30 tablet 0    aspirin EC 81 MG tablet Take 1 tablet (81 mg total) by mouth daily. Take after 12 weeks for prevention of preeclampsia later in pregnancy 300 tablet 2    Prenatal Vit-Fe Fumarate-FA (PREPLUS) 27-1 MG TABS Take 1 tablet by mouth daily. 30 tablet 13     Review of Systems  Constitutional:  Negative for appetite change, chills, fatigue and fever.  HENT:  Positive for ear pain, sinus pain and sore throat. Negative for tinnitus, trouble swallowing and voice change.   Respiratory:  Negative for chest tightness, shortness of breath and wheezing.   Gastrointestinal:  Negative for abdominal pain.  Genitourinary:  Negative for vaginal bleeding.  All other systems reviewed and are negative. Physical Exam   Blood pressure 129/72, pulse (!) 119, temperature 99.1 F (37.3 C), temperature source  Oral, resp. rate 18, last menstrual period 09/17/2020, SpO2 100 %, currently breastfeeding.  Physical Exam Vitals and nursing note reviewed.  Constitutional:      Appearance: She is well-developed. She is not ill-appearing.  HENT:     Right Ear: Tympanic membrane normal. No drainage, swelling or tenderness.     Left Ear: Tympanic membrane normal. No drainage, swelling or tenderness.     Mouth/Throat:     Mouth: Mucous membranes are moist. No oral lesions.     Pharynx: No pharyngeal swelling, oropharyngeal exudate, posterior oropharyngeal erythema or uvula swelling.     Tonsils: No tonsillar exudate or tonsillar abscesses. 0 on the right. 0 on the left.  Eyes:     Conjunctiva/sclera: Conjunctivae normal.  Cardiovascular:     Rate and Rhythm: Normal rate and  regular rhythm.     Heart sounds: Normal heart sounds.  Pulmonary:     Effort: Pulmonary effort is normal.     Breath sounds: Normal breath sounds.  Abdominal:     Palpations: Abdomen is soft.     Comments: Gravid  Lymphadenopathy:     Cervical: No cervical adenopathy.     Right cervical: No superficial cervical adenopathy.    Left cervical: No superficial cervical adenopathy.  Skin:    General: Skin is warm and dry.     Capillary Refill: Capillary refill takes less than 2 seconds.  Neurological:     Mental Status: She is alert and oriented to person, place, and time.  Psychiatric:        Mood and Affect: Mood normal.        Behavior: Behavior normal.    MAU Course/MDM  Procedures  Orders Placed This Encounter  Procedures   Culture, group A strep   Resp Panel by RT-PCR (Flu A&B, Covid) Nasopharyngeal Swab   Discharge patient   Patient Vitals for the past 24 hrs:  BP Temp Temp src Pulse Resp SpO2  02/01/21 1220 129/72 99.1 F (37.3 C) Oral (!) 119 18 100 %   Results for orders placed or performed during the hospital encounter of 02/01/21 (from the past 24 hour(s))  Resp Panel by RT-PCR (Flu A&B, Covid)     Status: None   Collection Time: 02/01/21 12:29 PM   Specimen: Nasopharyngeal(NP) swabs in vial transport medium  Result Value Ref Range   SARS Coronavirus 2 by RT PCR NEGATIVE NEGATIVE   Influenza A by PCR NEGATIVE NEGATIVE   Influenza B by PCR NEGATIVE NEGATIVE   Meds ordered this encounter  Medications   acetaminophen (TYLENOL) 325 MG tablet    Sig: Take 2 tablets (650 mg total) by mouth every 4 (four) hours as needed for moderate pain.    Dispense:  120 tablet    Refill:  0    Order Specific Question:   Supervising Provider    Answer:   CONSTANT, PEGGY [[id number]]   loratadine (CLARITIN) 10 MG tablet    Sig: Take 1 tablet (10 mg total) by mouth daily.    Dispense:  30 tablet    Refill:  0    Order Specific Question:   Supervising Provider    Answer:    CONSTANT, PEGGY [[id number]]   Benzocaine-Menthol (SORE THROAT LOZENGES) 15-3.6 MG LOZG    Sig: Use as directed 1 lozenge in the mouth or throat 3 (three) times daily as needed for up to 14 days.    Dispense:  42 lozenge    Refill:  0    Pharmacist  may substitute PRN based on coverage    Order Specific Question:   Supervising Provider    Answer:   CONSTANT, PEGGY [[id number]]   omeprazole (PRILOSEC OTC) 20 MG tablet    Sig: Take 1 tablet (20 mg total) by mouth daily.    Dispense:  30 tablet    Refill:  0    Order Specific Question:   Supervising Provider    Answer:   CONSTANT, PEGGY [[id number]]    Assessment and Plan  --40 y.o. [id number] at [redacted]w[redacted]d  --[place] 142 by Doppler --No evidence of ear infection --Sore throat, Strep swab in work --Negative FLU, COVID --Advised OTC medication for congestion --Acid reflux --Reviewed safe medications list, provided in AVS --Language barrier: remote interpreter utilized for all patient interaction --Discharge home in stable condition  Darlina Rumpf, CNM 02/01/2021, 5:42 PM

## 2021-02-01 NOTE — Discharge Instructions (Signed)

## 2021-02-01 NOTE — MAU Note (Signed)
Carol Walls is a 40 y.o. at [redacted]w[redacted]d here in MAU reporting: for 2 days has had a sore throat and headache. States she cannot eat or drink well. Also having pain in her ear. Has not taken any medication for pain.   Onset of complaint: ongoing for 2 days  Pain score: headache 1/10, throat 10/10  Vitals:   02/01/21 1220  BP: 129/72  Pulse: (!) 119  Resp: 18  Temp: 99.1 F (37.3 C)  SpO2: 100%     FHT:142  Lab orders placed from triage: none

## 2021-02-03 LAB — CULTURE, GROUP A STREP (THRC)

## 2021-02-12 DIAGNOSIS — O24419 Gestational diabetes mellitus in pregnancy, unspecified control: Secondary | ICD-10-CM

## 2021-02-12 HISTORY — DX: Gestational diabetes mellitus in pregnancy, unspecified control: O24.419

## 2021-02-27 ENCOUNTER — Ambulatory Visit (INDEPENDENT_AMBULATORY_CARE_PROVIDER_SITE_OTHER): Payer: Medicaid Other | Admitting: Obstetrics and Gynecology

## 2021-02-27 ENCOUNTER — Other Ambulatory Visit: Payer: Self-pay

## 2021-02-27 ENCOUNTER — Ambulatory Visit: Payer: Medicaid Other | Attending: Obstetrics and Gynecology

## 2021-02-27 ENCOUNTER — Other Ambulatory Visit: Payer: Self-pay | Admitting: *Deleted

## 2021-02-27 ENCOUNTER — Ambulatory Visit: Payer: Medicaid Other | Admitting: *Deleted

## 2021-02-27 VITALS — BP 119/67 | HR 103 | Wt 189.1 lb

## 2021-02-27 VITALS — BP 119/67 | HR 103

## 2021-02-27 DIAGNOSIS — O09522 Supervision of elderly multigravida, second trimester: Secondary | ICD-10-CM | POA: Diagnosis present

## 2021-02-27 DIAGNOSIS — Z3A23 23 weeks gestation of pregnancy: Secondary | ICD-10-CM | POA: Diagnosis not present

## 2021-02-27 DIAGNOSIS — D259 Leiomyoma of uterus, unspecified: Secondary | ICD-10-CM

## 2021-02-27 DIAGNOSIS — O34219 Maternal care for unspecified type scar from previous cesarean delivery: Secondary | ICD-10-CM | POA: Insufficient documentation

## 2021-02-27 DIAGNOSIS — O0990 Supervision of high risk pregnancy, unspecified, unspecified trimester: Secondary | ICD-10-CM

## 2021-02-27 DIAGNOSIS — Z98891 History of uterine scar from previous surgery: Secondary | ICD-10-CM

## 2021-02-27 DIAGNOSIS — O0992 Supervision of high risk pregnancy, unspecified, second trimester: Secondary | ICD-10-CM

## 2021-02-27 DIAGNOSIS — D219 Benign neoplasm of connective and other soft tissue, unspecified: Secondary | ICD-10-CM

## 2021-02-27 NOTE — Progress Notes (Signed)
° °  PRENATAL VISIT NOTE  Subjective:  Carol Walls is a 41 y.o. L4T6256 at [redacted]w[redacted]d being seen today for ongoing prenatal care.  She is currently monitored for the following issues for this high-risk pregnancy and has Syncope; Palpitation; Supervision of high risk pregnancy, antepartum; Advanced maternal age in multigravida; H/O: C-section; and Fibroid on their problem list.  Patient reports no complaints.  Contractions: Not present. Vag. Bleeding: None.  Movement: Present. Denies leaking of fluid.   The following portions of the patient's history were reviewed and updated as appropriate: allergies, current medications, past family history, past medical history, past social history, past surgical history and problem list.   Objective:   Vitals:   02/27/21 1514  BP: 119/67  Pulse: (!) 103  Weight: 189 lb 1.6 oz (85.8 kg)    Fetal Status: Fetal Heart Rate (bpm): 150 Fundal Height: 23 cm Movement: Present     General:  Alert, oriented and cooperative. Patient is in no acute distress.  Skin: Skin is warm and dry. No rash noted.   Cardiovascular: Normal heart rate noted  Respiratory: Normal respiratory effort, no problems with respiration noted  Abdomen: Soft, gravid, appropriate for gestational age.  Pain/Pressure: Absent     Pelvic: Cervical exam deferred        Extremities: Normal range of motion.  Edema: None  Mental Status: Normal mood and affect. Normal behavior. Normal judgment and thought content.   Assessment and Plan:  Pregnancy: [id number] at [redacted]w[redacted]d 1. Supervision of high risk pregnancy, antepartum Anatomy incomplete. For repeat US today to complete anatomy. Growth was 59%ile, anatomy completed. F/u planned for 8w from now.   2. Multigravida of advanced maternal age in second trimester Weekly BPP at 53  3. H/O: C-section Schedule next time for c-section at 39w. May want TOLAC if spontaneous labor prior to that.   4. Fibroid 2 intramural fibroids, 3 and 5 cm  Preterm labor  symptoms and general obstetric precautions including but not limited to vaginal bleeding, contractions, leaking of fluid and fetal movement were reviewed in detail with the patient.  Please refer to After Visit Summary for other counseling recommendations.   Return for 2 hr GTT, OB VISIT, MD or APP.  Future Appointments  Date Time Provider [place]  03/13/2021  8:15 AM Genia Del, MD [facility]  03/13/2021  8:50 AM [facility] LAB [facility]  04/24/2021 12:30 PM [facility] NURSE [facility]  04/24/2021 12:45 PM [facility] US4 [facility] [place]    Radene Gunning, MD

## 2021-03-10 ENCOUNTER — Other Ambulatory Visit: Payer: Self-pay

## 2021-03-10 DIAGNOSIS — O0990 Supervision of high risk pregnancy, unspecified, unspecified trimester: Secondary | ICD-10-CM

## 2021-03-13 ENCOUNTER — Encounter: Payer: Self-pay | Admitting: Family Medicine

## 2021-03-13 ENCOUNTER — Other Ambulatory Visit: Payer: Self-pay

## 2021-03-13 ENCOUNTER — Ambulatory Visit (INDEPENDENT_AMBULATORY_CARE_PROVIDER_SITE_OTHER): Payer: Medicaid Other | Admitting: Family Medicine

## 2021-03-13 ENCOUNTER — Other Ambulatory Visit: Payer: Medicaid Other

## 2021-03-13 VITALS — BP 123/72 | HR 93 | Wt 192.1 lb

## 2021-03-13 DIAGNOSIS — O26899 Other specified pregnancy related conditions, unspecified trimester: Secondary | ICD-10-CM

## 2021-03-13 DIAGNOSIS — O0990 Supervision of high risk pregnancy, unspecified, unspecified trimester: Secondary | ICD-10-CM

## 2021-03-13 DIAGNOSIS — Z3A25 25 weeks gestation of pregnancy: Secondary | ICD-10-CM

## 2021-03-13 DIAGNOSIS — Z98891 History of uterine scar from previous surgery: Secondary | ICD-10-CM

## 2021-03-13 DIAGNOSIS — G5600 Carpal tunnel syndrome, unspecified upper limb: Secondary | ICD-10-CM

## 2021-03-13 DIAGNOSIS — O09529 Supervision of elderly multigravida, unspecified trimester: Secondary | ICD-10-CM

## 2021-03-13 NOTE — Patient Instructions (Signed)
You can try wearing a wrist brace to see if this helps with your symptoms. It is okay to take vitamin B6 as needed if you feel this helps.

## 2021-03-13 NOTE — Progress Notes (Signed)
° °  PRENATAL VISIT NOTE  Subjective:  Carol Walls is a 41 y.o. A7G8115 at [redacted]w[redacted]d being seen today for ongoing prenatal care.  She is currently monitored for the following issues for this high-risk pregnancy and has Syncope; Palpitation; Supervision of high risk pregnancy, antepartum; Advanced maternal age in multigravida; H/O: C-section; and Fibroid on their problem list.  Patient reports symptoms of carpal tunnel today. Primarily in the right hand. Wondering if she can take vitamin B6 for this.  Contractions: Not present. Vag. Bleeding: None.  Movement: Present. Denies leaking of fluid.   The following portions of the patient's history were reviewed and updated as appropriate: allergies, current medications, past family history, past medical history, past social history, past surgical history and problem list.   Objective:   Vitals:   03/13/21 0838  BP: 123/72  Pulse: 93  Weight: 192 lb 1.6 oz (87.1 kg)    Fetal Status: Fetal Heart Rate (bpm): 147   Movement: Present  General:  Alert, oriented and cooperative. Patient is in no acute distress.  Skin: Skin is warm and dry. No rash noted.   Cardiovascular: Normal heart rate noted.  Respiratory: Normal respiratory effort, no problems with respiration noted.  Abdomen: Soft, gravid, appropriate for gestational age.  Pain/Pressure: Absent.     Pelvic: Cervical exam deferred.  Extremities: Normal range of motion.  Edema: None.  Mental Status: Normal mood and affect. Normal behavior. Normal judgment and thought content.   Assessment and Plan:  Pregnancy: B2I2035 at [redacted]w[redacted]d  1. Supervision of high risk pregnancy, antepartum 2. [redacted] weeks gestation of pregnancy Progressing well. FHT and VSS within normal limits. Will follow up in 4 weeks for next prenatal visit.   3. Antepartum multigravida of advanced maternal age  49. H/O: C-section Briefly discussed TOLAC versus repeat CS today. Patient voiced understanding. Will plan to discuss further  closer to term.  5. Carpal tunnel syndrome during pregnancy Recommended wrist bracing nightly to help with this. Will reassess next visit.  Preterm labor symptoms and general obstetric precautions including but not limited to vaginal bleeding, contractions, leaking of fluid and fetal movement were reviewed in detail with the patient.  Please refer to After Visit Summary for other counseling recommendations.   Return in about 4 weeks (around 04/10/2021) for follow up HR OB visit.  Future Appointments  Date Time Provider [place]  04/03/2021  3:35 PM Radene Gunning, MD [facility]  04/24/2021 12:30 PM [facility] NURSE [facility]  04/24/2021 12:45 PM [facility] US4 [facility]    Genia Del, MD

## 2021-03-13 NOTE — Progress Notes (Signed)
Pt states having numbing in right hand. Possible carpel tunnel.

## 2021-03-14 ENCOUNTER — Telehealth: Payer: Self-pay

## 2021-03-14 ENCOUNTER — Other Ambulatory Visit: Payer: Self-pay | Admitting: Family Medicine

## 2021-03-14 DIAGNOSIS — O24419 Gestational diabetes mellitus in pregnancy, unspecified control: Secondary | ICD-10-CM

## 2021-03-14 DIAGNOSIS — O99019 Anemia complicating pregnancy, unspecified trimester: Secondary | ICD-10-CM | POA: Insufficient documentation

## 2021-03-14 DIAGNOSIS — D509 Iron deficiency anemia, unspecified: Secondary | ICD-10-CM | POA: Insufficient documentation

## 2021-03-14 DIAGNOSIS — D696 Thrombocytopenia, unspecified: Secondary | ICD-10-CM | POA: Insufficient documentation

## 2021-03-14 DIAGNOSIS — O99119 Other diseases of the blood and blood-forming organs and certain disorders involving the immune mechanism complicating pregnancy, unspecified trimester: Secondary | ICD-10-CM

## 2021-03-14 LAB — CBC
Hematocrit: 31.3 % — ABNORMAL LOW (ref 34.0–46.6)
Hemoglobin: 10.8 g/dL — ABNORMAL LOW (ref 11.1–15.9)
MCH: 29.5 pg (ref 26.6–33.0)
MCHC: 34.5 g/dL (ref 31.5–35.7)
MCV: 86 fL (ref 79–97)
Platelets: 109 10*3/uL — ABNORMAL LOW (ref 150–450)
RBC: 3.66 x10E6/uL — ABNORMAL LOW (ref 3.77–5.28)
RDW: 12.6 % (ref 11.7–15.4)
WBC: 5.1 10*3/uL (ref 3.4–10.8)

## 2021-03-14 LAB — GLUCOSE TOLERANCE, 2 HOURS W/ 1HR
Glucose, 1 hour: 164 mg/dL (ref 70–179)
Glucose, 2 hour: 162 mg/dL — ABNORMAL HIGH (ref 70–152)
Glucose, Fasting: 87 mg/dL (ref 70–91)

## 2021-03-14 LAB — HIV ANTIBODY (ROUTINE TESTING W REFLEX): HIV Screen 4th Generation wRfx: NONREACTIVE

## 2021-03-14 LAB — RPR: RPR Ser Ql: NONREACTIVE

## 2021-03-14 MED ORDER — FERROUS SULFATE 325 (65 FE) MG PO TABS: 325.0000 mg | ORAL_TABLET | ORAL | 2 refills | Status: DC

## 2021-03-14 MED ORDER — BLOOD GLUCOSE MONITOR KIT: PACK | 0 refills | Status: DC

## 2021-03-14 NOTE — Telephone Encounter (Addendum)
-----   Message from Genia Del, MD sent at 03/14/2021  9:37 AM EST ----- Patient meets criteria for GDM. Also has low platelets and mild anemia. I will send in supplies for patient to check her sugars and oral iron. She will need to meet with our diabetic educator. Please call patient and help coordinate this for her. Thank you!   -Vilma Meckel   [facility] pt with [facility] interpreter ID [id number]. Results and provider recommendation reviewed. Pt instructed to pick up supplies prior to education appt.

## 2021-03-17 ENCOUNTER — Encounter: Payer: Medicaid Other | Attending: Family Medicine | Admitting: Dietician

## 2021-03-17 ENCOUNTER — Other Ambulatory Visit: Payer: Self-pay

## 2021-03-17 ENCOUNTER — Encounter: Payer: Self-pay | Admitting: Dietician

## 2021-03-17 DIAGNOSIS — Z3A00 Weeks of gestation of pregnancy not specified: Secondary | ICD-10-CM | POA: Insufficient documentation

## 2021-03-17 DIAGNOSIS — O24419 Gestational diabetes mellitus in pregnancy, unspecified control: Secondary | ICD-10-CM | POA: Diagnosis not present

## 2021-03-17 NOTE — Patient Instructions (Signed)
Check your blood sugar 4 times daily and write it down  Before breakfast  2 hours after breakfast  2 hours after lunch  2 hours after dinner  Avoid sugar in your tea and choose stevia or unsweetened instead. Choose low fat milk Air fry or bake rather than using oil Eat vegetables

## 2021-03-17 NOTE — Progress Notes (Signed)
Start 1350  End 1450 Patient was seen on 03/17/2021 for Gestational Diabetes self-management.  She is [redacted] weeks gestation.  Medications:  Prenatal vitamin, iron Medical History:  GDM  24 hr Recall:  First Meal:  bread, hot tea with whole or 1% milk with 2 tsp sugar Snack:  cheese or egg, bread Second meal:  leftovers Snack:fruit (orange or banana) Third meal:  Fish or chicken, or meat, vegetables, occasional rice, bread Snack:none Beverages:  water, OJ, hot tea with milk and 2 tsp sugar (2-3 cups daily)  Patient Objectives Learned: States the definition of Gestational Diabetes States why dietary management is important in controlling blood glucose Describes the effects each nutrient has on blood glucose levels Demonstrates ability to create a balanced meal plan Demonstrates carbohydrate counting  States when to check blood glucose levels Demonstrates proper blood glucose monitoring techniques States the effect of stress and exercise on blood glucose levels States the importance of limiting caffeine and abstaining from alcohol and smoking  NUTRITION INTERVENTION  Nutrition education (E-1) on the following topics:   Self monitoring hygiene, numbers to aim for, how often to check Hypoglycemia Simple carbohydrates verses complex carbohydrates  Physical activity and its effect on blood sugar  Proper Hydration Neccessaty of limiting simple carbohydrates and calorically dense foods Stress reduction worth and techniques   Blood glucose monitoring:  AccuChek Guide Me meter from home Tested in office 132 2 hours after lunch  Patient instructed to monitor glucose levels: FBS: 60 - <90 2 hour: <120  *Patient received handouts: Nutrition Diabetes and Pregnancy in Arabic   Patient will be seen for follow-up as needed.

## 2021-03-28 ENCOUNTER — Other Ambulatory Visit: Payer: Medicaid Other

## 2021-04-03 ENCOUNTER — Ambulatory Visit (INDEPENDENT_AMBULATORY_CARE_PROVIDER_SITE_OTHER): Payer: Medicaid Other | Admitting: Obstetrics and Gynecology

## 2021-04-03 ENCOUNTER — Encounter: Payer: Self-pay | Admitting: Obstetrics and Gynecology

## 2021-04-03 ENCOUNTER — Other Ambulatory Visit: Payer: Self-pay

## 2021-04-03 VITALS — BP 109/70 | HR 116 | Wt 193.0 lb

## 2021-04-03 DIAGNOSIS — G5600 Carpal tunnel syndrome, unspecified upper limb: Secondary | ICD-10-CM

## 2021-04-03 DIAGNOSIS — O0990 Supervision of high risk pregnancy, unspecified, unspecified trimester: Secondary | ICD-10-CM

## 2021-04-03 DIAGNOSIS — Z98891 History of uterine scar from previous surgery: Secondary | ICD-10-CM

## 2021-04-03 DIAGNOSIS — D696 Thrombocytopenia, unspecified: Secondary | ICD-10-CM

## 2021-04-03 DIAGNOSIS — O99019 Anemia complicating pregnancy, unspecified trimester: Secondary | ICD-10-CM

## 2021-04-03 DIAGNOSIS — O24410 Gestational diabetes mellitus in pregnancy, diet controlled: Secondary | ICD-10-CM

## 2021-04-03 DIAGNOSIS — O99113 Other diseases of the blood and blood-forming organs and certain disorders involving the immune mechanism complicating pregnancy, third trimester: Secondary | ICD-10-CM

## 2021-04-03 DIAGNOSIS — O26899 Other specified pregnancy related conditions, unspecified trimester: Secondary | ICD-10-CM

## 2021-04-03 DIAGNOSIS — D509 Iron deficiency anemia, unspecified: Secondary | ICD-10-CM

## 2021-04-03 DIAGNOSIS — O99119 Other diseases of the blood and blood-forming organs and certain disorders involving the immune mechanism complicating pregnancy, unspecified trimester: Secondary | ICD-10-CM

## 2021-04-03 DIAGNOSIS — O09529 Supervision of elderly multigravida, unspecified trimester: Secondary | ICD-10-CM

## 2021-04-03 MED ORDER — ACCU-CHEK GUIDE VI STRP: ORAL_STRIP | 3 refills | Status: DC

## 2021-04-03 MED ORDER — ACCU-CHEK SOFTCLIX LANCETS MISC: 1.0000 | Freq: Four times a day (QID) | 3 refills | Status: DC

## 2021-04-03 MED ORDER — PREPLUS 27-1 MG PO TABS: 1.0000 | ORAL_TABLET | Freq: Every day | ORAL | 3 refills | Status: DC

## 2021-04-03 NOTE — Progress Notes (Signed)
° °  PRENATAL VISIT NOTE  Subjective:  Carol Walls is a 41 y.o. [id number] at [redacted]w[redacted]d being seen today for ongoing prenatal care.  She is currently monitored for the following issues for this high-risk pregnancy and has Syncope; Palpitation; Supervision of high risk pregnancy, antepartum; Advanced maternal age in multigravida; H/O: C-section; Fibroid; Iron deficiency anemia during pregnancy; Gestational diabetes mellitus (GDM), antepartum; and Thrombocytopenia affecting pregnancy ([place]) on their problem list.  Patient reports no complaints.  Contractions: Not present. Vag. Bleeding: None.  Movement: Present. Denies leaking of fluid.   The following portions of the patient's history were reviewed and updated as appropriate: allergies, current medications, past family history, past medical history, past social history, past surgical history and problem list.   Objective:   Vitals:   04/03/21 1603  BP: 109/70  Pulse: (!) 116  Weight: 193 lb (87.5 kg)    Fetal Status: Fetal Heart Rate (bpm): 140 Fundal Height: 28 cm Movement: Present     General:  Alert, oriented and cooperative. Patient is in no acute distress.  Skin: Skin is warm and dry. No rash noted.   Cardiovascular: Normal heart rate noted  Respiratory: Normal respiratory effort, no problems with respiration noted  Abdomen: Soft, gravid, appropriate for gestational age.  Pain/Pressure: Absent     Pelvic: Cervical exam deferred        Extremities: Normal range of motion.     Mental Status: Normal mood and affect. Normal behavior. Normal judgment and thought content.   Assessment and Plan:  Pregnancy: [id number] at [redacted]w[redacted]d 1. Benign gestational thrombocytopenia in third trimester ([place]) - CBC today - last platelets were 109. If remain above 100, would check monthly until delivery.   2. Diet controlled gestational diabetes mellitus (GDM), antepartum - Reviewed CBGs - her log was reviewed. Her fastings are normal almost 100% of the time. Her  meals are mostly normal >50%. Her breakfast PP were right after eating, not 2 hr PP so she will start doing them that way as well.  - Next [place] is 3/13. Growth on 1/16 was normal.   3. Supervision of high risk pregnancy, antepartum - Refills on PNV given - She notes carpal tunnel. She tried wrist splints but has not been able to sleep due to her waking with her hands numb. Will refer to Ortho.   4. Antepartum multigravida of advanced maternal age  31. H/O: C-section - Message sent to schedule for 5/6. Desires TOLAC if labor before.   6. Iron deficiency anemia during pregnancy - Continue PO Iron  Preterm labor symptoms and general obstetric precautions including but not limited to vaginal bleeding, contractions, leaking of fluid and fetal movement were reviewed in detail with the patient. Please refer to After Visit Summary for other counseling recommendations.   Return in about 2 weeks (around 04/17/2021) for OB VISIT, MD or APP.  Future Appointments  Date Time Provider [place]  04/11/2021  3:15 PM [facility]  04/17/2021  3:15 PM Kieth Brightly [facility]  04/24/2021 12:30 PM [facility] NURSE [facility]  04/24/2021 12:45 PM [facility] US4 [facility] [place]    Radene Gunning, MD

## 2021-04-03 NOTE — Progress Notes (Signed)
Patient here for routine prenatal care.  She stated that baby is moving well and denies any pain, vaginal bleeding or abnormal vaginal discharge.  I offered patient TDAP vaccine and she declined.   Devra Stare, CMA

## 2021-04-04 ENCOUNTER — Telehealth: Payer: Self-pay

## 2021-04-04 LAB — CBC
Hematocrit: 30.3 % — ABNORMAL LOW (ref 34.0–46.6)
Hemoglobin: 10.4 g/dL — ABNORMAL LOW (ref 11.1–15.9)
MCH: 29.6 pg (ref 26.6–33.0)
MCHC: 34.3 g/dL (ref 31.5–35.7)
MCV: 86 fL (ref 79–97)
Platelets: 107 10*3/uL — ABNORMAL LOW (ref 150–450)
RBC: 3.51 x10E6/uL — ABNORMAL LOW (ref 3.77–5.28)
RDW: 13 % (ref 11.7–15.4)
WBC: 4.4 10*3/uL (ref 3.4–10.8)

## 2021-04-04 NOTE — Telephone Encounter (Signed)
-----   Message from Radene Gunning, MD sent at 04/04/2021 10:13 AM EST ----- Can you let her know her platelets are stable but her anemia has worsened some. Please confirm she is taking her iron regularly and if so, we will continue to monitor. If it worsens, she may need IV iron.   Thanks, pad

## 2021-04-04 NOTE — Telephone Encounter (Addendum)
Called pt with [place] interpreter; VM left stating I am calling to follow up on medication. Requested a call back. Will attempt to contact patient a second time.

## 2021-04-05 NOTE — Telephone Encounter (Signed)
Called pt with [place] # Carol Walls [phone] pt confirms that she is taking her iron table every other day.  I explained to the pt that the provider has stated that her blood levels has decreased and she may need an iron IV infusion.  At this time her levels will continue to be monitored.  Pt verbalized understanding with no further questions.   [name]  04/05/21

## 2021-04-10 ENCOUNTER — Encounter: Payer: Self-pay | Admitting: Orthopedic Surgery

## 2021-04-10 ENCOUNTER — Other Ambulatory Visit: Payer: Self-pay

## 2021-04-10 ENCOUNTER — Ambulatory Visit (INDEPENDENT_AMBULATORY_CARE_PROVIDER_SITE_OTHER): Payer: Medicaid Other | Admitting: Orthopedic Surgery

## 2021-04-10 DIAGNOSIS — G5600 Carpal tunnel syndrome, unspecified upper limb: Secondary | ICD-10-CM | POA: Insufficient documentation

## 2021-04-10 DIAGNOSIS — O26899 Other specified pregnancy related conditions, unspecified trimester: Secondary | ICD-10-CM

## 2021-04-10 DIAGNOSIS — G5602 Carpal tunnel syndrome, left upper limb: Secondary | ICD-10-CM | POA: Diagnosis not present

## 2021-04-10 DIAGNOSIS — G5603 Carpal tunnel syndrome, bilateral upper limbs: Secondary | ICD-10-CM | POA: Diagnosis not present

## 2021-04-10 DIAGNOSIS — G5601 Carpal tunnel syndrome, right upper limb: Secondary | ICD-10-CM | POA: Diagnosis not present

## 2021-04-10 MED ORDER — LIDOCAINE HCL 1 % IJ SOLN
1.0000 mL | INTRAMUSCULAR | Status: AC | PRN
  Administered 2021-04-10: 1 mL

## 2021-04-10 MED ORDER — BETAMETHASONE SOD PHOS & ACET 6 (3-3) MG/ML IJ SUSP
6.0000 mg | INTRAMUSCULAR | Status: AC | PRN
  Administered 2021-04-10: 6 mg via INTRA_ARTICULAR

## 2021-04-10 NOTE — Progress Notes (Signed)
Office Visit Note   Patient: Carol Walls           Date of Birth: [birth year]           MRN: [id number] Visit Date: 04/10/2021              Requested by: Radene Gunning, MD [place],  [place] [id number] PCP: Patient, No Pcp Per (Inactive)   Assessment & Plan: Visit Diagnoses:  1. Carpal tunnel syndrome during pregnancy     Plan: We discussed the diagnosis, prognosis, non-operative treatment options for carpal tunnel syndrome of pregnancy.  She has been wearing a night splint for many weeks now without symptom relief.  After our discussion, the patient would like to proceed with corticosteroid injection.  We reviewed the risks and benefits of conservative management.  She will pay close attention to her blood sugar over the next week or so after the injection. The patient expressed understanding of the reasoning and strategy going forward.  All patient questions and concerns were addressed.    Follow-Up Instructions: No follow-ups on file.   Orders:  No orders of the defined types were placed in this encounter.  No orders of the defined types were placed in this encounter.     Procedures: Hand/UE Inj: R carpal tunnel for carpal tunnel syndrome on 04/10/2021 9:44 AM Indications: therapeutic Details: 25 G needle, volar approach Medications: 1 mL lidocaine 1 %; 6 mg betamethasone acetate-betamethasone sodium phosphate 6 (3-3) MG/ML Outcome: tolerated well, no immediate complications Procedure, treatment alternatives, risks and benefits explained, specific risks discussed. Consent was given by the patient. Immediately prior to procedure a time out was called to verify the correct patient, procedure, equipment, support staff and site/side marked as required. Patient was prepped and draped in the usual sterile fashion.      Clinical Data: No additional findings.   Subjective: Chief Complaint  Patient presents with   Right Hand - Pain, Numbness   Left Hand -  Numbness, Pain    This a 41 year old right-hand-dominant female who presents with pregnancy related numbness and tingling in the right hand.  This been going on for several months now.  She describes numbness and tingling all of her fingers that is only at night and first in the morning.  Wakes her up from sleep 7 nights of the week.  She had this with each pregnancy (5 children) but it is worse this time.  She is also been diagnosed with gestational diabetes.  She has been wearing a wrist splint which has provided minimal symptom relief.  She was referred to our office by her OB/GYN.  She has a scheduled C-section planned for 06/17/2021.   Review of Systems   Objective: Vital Signs: Ht 5\' 6"  (1.676 m)    Wt 193 lb (87.5 kg)    LMP 09/17/2020    BMI 31.15 kg/m   Physical Exam Constitutional:      Appearance: Normal appearance.  Cardiovascular:     Rate and Rhythm: Normal rate.     Pulses: Normal pulses.  Pulmonary:     Effort: Pulmonary effort is normal.  Skin:    General: Skin is warm and dry.     Capillary Refill: Capillary refill takes less than 2 seconds.  Neurological:     Mental Status: She is alert.    Right Hand Exam   Tenderness  The patient is experiencing no tenderness.   Range of Motion  The patient has normal  right wrist ROM.   Other  Erythema: absent Sensation: normal Pulse: present  Comments:  + Tinel and Phalen signs.  Negative Tinel at elbow.      Specialty Comments:  No specialty comments available.  Imaging: No results found.   PMFS History: Patient Active Problem List   Diagnosis Date Noted   Carpal tunnel syndrome during pregnancy 04/10/2021   Iron deficiency anemia during pregnancy 03/14/2021   Gestational diabetes mellitus (GDM), antepartum 03/14/2021   Thrombocytopenia affecting pregnancy ([place]) 03/14/2021   Advanced maternal age in multigravida 12/27/2020   H/O: C-section 12/27/2020   Fibroid 12/27/2020   Supervision of high risk  pregnancy, antepartum 12/19/2020   Syncope 12/25/2019   Palpitation 12/25/2019   Past Medical History:  Diagnosis Date   Back pain    Fibroids    GDM (gestational diabetes mellitus) 02/2021    Family History  Problem Relation Age of Onset   Heart attack Brother 29    Past Surgical History:  Procedure Laterality Date   CESAREAN SECTION     CESAREAN SECTION  12/06/2011   Procedure: CESAREAN SECTION;  Surgeon: Frederico Hamman, MD;  Location: [facility];  Service: Obstetrics;  Laterality: N/A;  Repeat Cesarean Section with birth of baby A Boy @ 2125, 30 B Girl  @ 2127   Social History   Occupational History   Not on file  Tobacco Use   Smoking status: Never   Smokeless tobacco: Never  Substance and Sexual Activity   Alcohol use: Never   Drug use: Never   Sexual activity: Yes    Birth control/protection: None

## 2021-04-11 ENCOUNTER — Ambulatory Visit (INDEPENDENT_AMBULATORY_CARE_PROVIDER_SITE_OTHER): Payer: Medicaid Other | Admitting: Registered"

## 2021-04-11 ENCOUNTER — Other Ambulatory Visit: Payer: Self-pay

## 2021-04-11 ENCOUNTER — Encounter: Payer: Medicaid Other | Admitting: Registered"

## 2021-04-11 DIAGNOSIS — O24410 Gestational diabetes mellitus in pregnancy, diet controlled: Secondary | ICD-10-CM

## 2021-04-11 DIAGNOSIS — O24419 Gestational diabetes mellitus in pregnancy, unspecified control: Secondary | ICD-10-CM | POA: Diagnosis not present

## 2021-04-11 NOTE — Progress Notes (Signed)
Interpreter by [employer] video Ellard Artis [phone]; interpreter needed to leave and switch to [place] for last 10 min  Patient was seen for Gestational Diabetes self-management on 04/11/21  Start time 1523 and End time 1620   Estimated due date: 06/24/21; [redacted]w[redacted]d  Clinical: Medications: reviewed Medical History: reviewed Labs: OGTT 2-hr 162, A1c 5.5% (12/27/20)  Dietary and Lifestyle History: Pt states that her iron is high and had questions about the injections. RD asked patient to address these questions to the clinical staff.   Pt states Mid-march will start her maternity leave from work. Until then she reports her work hours vary depending on when she starts, has to work 8 hours so may get off work anywhere between 1-4 pm and that affects what time she eats dinner.  Pt SMBG log: FBS 72-114 mg/dL; PPBG 95-183 mg/dL  Physical Activity: not assessed Stress: not assessed Sleep: not assessed  24 hr Recall:  First Meal: white pita bread, cheese, tea with 3 spoons powdered milk and 1/2 Tbs sugar and a little coffee OR 4-5 cookies and tea Snack:  Second meal: fish, 1 1/2 c rice, vegetables, water Snack: fruit or crackers Third meal: okra, beans, juice water Snack: Beverages: juice, water  NUTRITION INTERVENTION  Nutrition education (E-1) on the following topics:   Initial Follow-up  [x]  []  Definition of Gestational Diabetes []  []  Why dietary management is important in controlling blood glucose []  []  Effects each nutrient has on blood glucose levels []  []  Simple carbohydrates vs complex carbohydrates []  []  Fluid intake [x]  []  Creating a balanced meal plan [x]  []  Carbohydrate counting  [x]  []  When to check blood glucose levels [x]  []  Proper blood glucose monitoring techniques [x]  []  Effect of stress and stress reduction techniques  [x]  []  Exercise effect on blood glucose levels, appropriate exercise during pregnancy []  []  Importance of limiting caffeine and abstaining from alcohol  and smoking [x]  []  Medications used for blood sugar control during pregnancy []  []  Hypoglycemia and rule of 15 []  []  Postpartum self care  Patient already has a meter, is  testing pre breakfast and 2 hours after each meal.  Patient instructed to monitor glucose levels: FBS: 60 - ? 95 mg/dL (some clinics use 90 for cutoff) 1 hour: ? 140 mg/dL 2 hour: ? 120 mg/dL  Patient received handouts: Nutrition Diabetes and Pregnancy Carbohydrate Counting List  Patient will be seen for follow-up as needed.

## 2021-04-17 ENCOUNTER — Ambulatory Visit (INDEPENDENT_AMBULATORY_CARE_PROVIDER_SITE_OTHER): Payer: Medicaid Other | Admitting: Advanced Practice Midwife

## 2021-04-17 ENCOUNTER — Other Ambulatory Visit: Payer: Self-pay

## 2021-04-17 VITALS — BP 120/75 | HR 97 | Wt 192.0 lb

## 2021-04-17 DIAGNOSIS — O99113 Other diseases of the blood and blood-forming organs and certain disorders involving the immune mechanism complicating pregnancy, third trimester: Secondary | ICD-10-CM

## 2021-04-17 DIAGNOSIS — Z3A30 30 weeks gestation of pregnancy: Secondary | ICD-10-CM

## 2021-04-17 DIAGNOSIS — O24410 Gestational diabetes mellitus in pregnancy, diet controlled: Secondary | ICD-10-CM

## 2021-04-17 DIAGNOSIS — Z98891 History of uterine scar from previous surgery: Secondary | ICD-10-CM

## 2021-04-17 DIAGNOSIS — O0990 Supervision of high risk pregnancy, unspecified, unspecified trimester: Secondary | ICD-10-CM

## 2021-04-17 DIAGNOSIS — D696 Thrombocytopenia, unspecified: Secondary | ICD-10-CM

## 2021-04-17 NOTE — Progress Notes (Signed)
? ?  PRENATAL VISIT NOTE ? ?Subjective:  ?Carol Walls is a 41 y.o. [id number] at 66w2dbeing seen today for ongoing prenatal care.  She is currently monitored for the following issues for this high-risk pregnancy and has Syncope; Palpitation; Supervision of high risk pregnancy, antepartum; Advanced maternal age in multigravida; H/O: C-section; Fibroid; Iron deficiency anemia during pregnancy; Gestational diabetes mellitus (GDM), antepartum; Thrombocytopenia affecting pregnancy ([place]; and Carpal tunnel syndrome during pregnancy on their problem list. ? ?Patient reports no complaints.  Contractions: Not present. Vag. Bleeding: None.  Movement: Present. Denies leaking of fluid.  ? ?The following portions of the patient's history were reviewed and updated as appropriate: allergies, current medications, past family history, past medical history, past social history, past surgical history and problem list. Problem list updated. ? ?Objective:  ? ?Vitals:  ? 04/17/21 1600  ?BP: 120/75  ?Pulse: 97  ?Weight: 192 lb (87.1 kg)  ? ? ?Fetal Status: Fetal Heart Rate (bpm): 133   Movement: Present    ? ?General:  Alert, oriented and cooperative. Patient is in no acute distress.  ?Skin: Skin is warm and dry. No rash noted.   ?Cardiovascular: Normal heart rate noted  ?Respiratory: Normal respiratory effort, no problems with respiration noted  ?Abdomen: Soft, gravid, appropriate for gestational age.  Pain/Pressure: Absent     ?Pelvic: Cervical exam deferred        ?Extremities: Normal range of motion.  Edema: None  ?Mental Status: Normal mood and affect. Normal behavior. Normal judgment and thought content.  ? ? ?  ? ? ? ?Assessment and Plan:  ?Pregnancy: [id number] 368w2d ?1. Supervision of high risk pregnancy, antepartum ?--No acute concerns ?- Discussed scheduling overnight meals to accommodate Ramadan ?- Reviewed recent lab results including Hgb, confirmed she should continue Fe supplement ? ?2. Diet controlled gestational  diabetes mellitus (GDM), antepartum ?- Most readings WNL, rare outliers 2/2 diet choices ? ?3. Benign gestational thrombocytopenia in third [facility] ([facility]? ? ?4. H/O: C-section ?- For repeat on 05/06 ? ?5. [redacted] weeks gestation of pregnancy ?- Begin daily kick counts, interventions for low kick number, indications for evaluation in MAU ? ?Preterm labor symptoms and general obstetric precautions including but not limited to vaginal bleeding, contractions, leaking of fluid and fetal movement were reviewed in detail with the patient. ?Please refer to After Visit Summary for other counseling recommendations.  ?Return in about 2 weeks (around 05/01/2021). ? ?Future Appointments  ?Date Time Provider [place]?04/24/2021 12:30 PM [facility] NURSE [facility]  ?04/24/2021 12:45 PM [facility] US4 [facility]  ?05/22/2021 10:45 AM BeSherilyn CooterMD [facility] None  ? ? ?SaDarlina RumpfCNM ? ?

## 2021-04-24 ENCOUNTER — Ambulatory Visit: Payer: Medicaid Other | Attending: Obstetrics and Gynecology

## 2021-04-24 ENCOUNTER — Ambulatory Visit: Payer: Medicaid Other | Admitting: *Deleted

## 2021-04-24 ENCOUNTER — Other Ambulatory Visit: Payer: Self-pay

## 2021-04-24 ENCOUNTER — Encounter: Payer: Self-pay | Admitting: *Deleted

## 2021-04-24 VITALS — BP 121/68 | HR 101

## 2021-04-24 DIAGNOSIS — O09523 Supervision of elderly multigravida, third trimester: Secondary | ICD-10-CM | POA: Diagnosis not present

## 2021-04-24 DIAGNOSIS — D259 Leiomyoma of uterus, unspecified: Secondary | ICD-10-CM

## 2021-04-24 DIAGNOSIS — O0992 Supervision of high risk pregnancy, unspecified, second trimester: Secondary | ICD-10-CM | POA: Diagnosis present

## 2021-04-24 DIAGNOSIS — O34219 Maternal care for unspecified type scar from previous cesarean delivery: Secondary | ICD-10-CM

## 2021-04-24 DIAGNOSIS — O24410 Gestational diabetes mellitus in pregnancy, diet controlled: Secondary | ICD-10-CM | POA: Diagnosis not present

## 2021-04-24 DIAGNOSIS — O3410 Maternal care for benign tumor of corpus uteri, unspecified trimester: Secondary | ICD-10-CM | POA: Diagnosis not present

## 2021-04-24 DIAGNOSIS — O09529 Supervision of elderly multigravida, unspecified trimester: Secondary | ICD-10-CM | POA: Insufficient documentation

## 2021-04-24 DIAGNOSIS — Z3A31 31 weeks gestation of pregnancy: Secondary | ICD-10-CM

## 2021-04-24 DIAGNOSIS — O99213 Obesity complicating pregnancy, third trimester: Secondary | ICD-10-CM

## 2021-04-24 DIAGNOSIS — E669 Obesity, unspecified: Secondary | ICD-10-CM

## 2021-04-25 ENCOUNTER — Other Ambulatory Visit: Payer: Self-pay | Admitting: *Deleted

## 2021-04-25 DIAGNOSIS — O24410 Gestational diabetes mellitus in pregnancy, diet controlled: Secondary | ICD-10-CM

## 2021-04-25 DIAGNOSIS — Z6841 Body Mass Index (BMI) 40.0 and over, adult: Secondary | ICD-10-CM

## 2021-04-25 DIAGNOSIS — O09523 Supervision of elderly multigravida, third trimester: Secondary | ICD-10-CM

## 2021-05-04 ENCOUNTER — Other Ambulatory Visit: Payer: Self-pay | Admitting: Obstetrics & Gynecology

## 2021-05-04 DIAGNOSIS — Z98891 History of uterine scar from previous surgery: Secondary | ICD-10-CM

## 2021-05-08 ENCOUNTER — Other Ambulatory Visit: Payer: Self-pay

## 2021-05-08 ENCOUNTER — Ambulatory Visit (INDEPENDENT_AMBULATORY_CARE_PROVIDER_SITE_OTHER): Payer: Medicaid Other | Admitting: Family Medicine

## 2021-05-08 ENCOUNTER — Encounter: Payer: Self-pay | Admitting: Family Medicine

## 2021-05-08 VITALS — BP 106/73 | HR 97 | Wt 190.8 lb

## 2021-05-08 DIAGNOSIS — O24410 Gestational diabetes mellitus in pregnancy, diet controlled: Secondary | ICD-10-CM

## 2021-05-08 DIAGNOSIS — O0990 Supervision of high risk pregnancy, unspecified, unspecified trimester: Secondary | ICD-10-CM

## 2021-05-08 DIAGNOSIS — Z641 Problems related to multiparity: Secondary | ICD-10-CM

## 2021-05-08 DIAGNOSIS — D696 Thrombocytopenia, unspecified: Secondary | ICD-10-CM

## 2021-05-08 DIAGNOSIS — O09529 Supervision of elderly multigravida, unspecified trimester: Secondary | ICD-10-CM

## 2021-05-08 DIAGNOSIS — Z98891 History of uterine scar from previous surgery: Secondary | ICD-10-CM

## 2021-05-08 DIAGNOSIS — O99119 Other diseases of the blood and blood-forming organs and certain disorders involving the immune mechanism complicating pregnancy, unspecified trimester: Secondary | ICD-10-CM

## 2021-05-08 NOTE — Patient Instructions (Signed)

## 2021-05-09 NOTE — Progress Notes (Signed)
? ?  PRENATAL VISIT NOTE ? ?Subjective:  ?Carol Walls is a 41 y.o. [id number] at 8w3dbeing seen today for ongoing prenatal care.  She is currently monitored for the following issues for this high-risk pregnancy and has Syncope; Palpitation; Supervision of high risk pregnancy, antepartum; Advanced maternal age in multigravida; H/O: C-section; Fibroid; Iron deficiency anemia during pregnancy; Gestational diabetes mellitus (GDM), antepartum; Thrombocytopenia affecting pregnancy ([place]; Carpal tunnel syndrome during pregnancy; and [place] on their problem list. ? ?Patient reports no complaints.  Contractions: Not present. Vag. Bleeding: None.  Movement: Present. Denies leaking of fluid.  ? ?The following portions of the patient's history were reviewed and updated as appropriate: allergies, current medications, past family history, past medical history, past social history, past surgical history and problem list.  ? ?Objective:  ? ?Vitals:  ? 05/08/21 1601  ?BP: 106/73  ?Pulse: 97  ?Weight: 190 lb 12.8 oz (86.5 kg)  ? ? ?Fetal Status: Fetal Heart Rate (bpm): 126 Fundal Height: 34 cm Movement: Present    ? ?General:  Alert, oriented and cooperative. Patient is in no acute distress.  ?Skin: Skin is warm and dry. No rash noted.   ?Cardiovascular: Normal heart rate noted  ?Respiratory: Normal respiratory effort, no problems with respiration noted  ?Abdomen: Soft, gravid, appropriate for gestational age.  Pain/Pressure: Absent     ?Pelvic: Cervical exam deferred        ?Extremities: Normal range of motion.     ?Mental Status: Normal mood and affect. Normal behavior. Normal judgment and thought content.  ? ?Assessment and Plan:  ?Pregnancy: [id number] 357w3d1. Diet controlled gestational diabetes mellitus (GDM), antepartum ? ? ?Now fasting for Ramadan--CBGs improved ?Continue diet  ?Last u/s for growth 04/24/21 - vtx, 2190 gms (95%), upper limit of normal AFI ? ?2. Thrombocytopenia affecting pregnancy ([place]CPap[place]?Last plt  count 107--repeat at next visit ? ?3. Supervision of high risk pregnancy, antepartum ? ? ?4. Antepartum multigravida of advanced maternal age ?Low risk NIPT ? ?5. H/O: C-section ?x 2, for Repeat, scheduled. ? ?6. GrEl Mang[place]tipara ?At risk for PPH ? ?Preterm labor symptoms and general obstetric precautions including but not limited to vaginal bleeding, contractions, leaking of fluid and fetal movement were reviewed in detail with the patient. ?Please refer to After Visit Summary for other counseling recommendations.  ? ?Return in 2 weeks (on 05/22/2021) [facility]? ?Future Appointments  ?Date Time Provider [place]?05/22/2021 10:45 AM BeSherilyn CooterMD [facility] None  ?05/23/2021  1:15 PM [facility] NURSE [facility]  ?05/23/2021  1:30 PM [facility] US2 [facility]  ?05/24/2021  3:35 PM DuRadene GunningMD [facility]? ? ?TaDonnamae JudeMD ? ?

## 2021-05-22 ENCOUNTER — Ambulatory Visit: Payer: Medicaid Other | Admitting: Orthopedic Surgery

## 2021-05-22 NOTE — Progress Notes (Signed)
? ?  PRENATAL VISIT NOTE ? ?Subjective:  ?Carol Walls is a 41 y.o. [id number] at 28w4dbeing seen today for ongoing prenatal care.  She is currently monitored for the following issues for this high-risk pregnancy and has Syncope; Palpitation; Supervision of high risk pregnancy, antepartum; Advanced maternal age in multigravida; H/O: C-section; Fibroid; Iron deficiency anemia during pregnancy; Gestational diabetes mellitus (GDM), antepartum; Thrombocytopenia affecting pregnancy (HH[place]; Carpal tunnel syndrome during pregnancy; and GNew Bernmultip[place] on their problem list. ? ?Patient reports no complaints.  Contractions: Not present. Vag. Bleeding: None.  Movement: Present. Denies leaking of fluid.  ? ?The following portions of the patient's history were reviewed and updated as appropriate: allergies, current medications, past family history, past medical history, past social history, past surgical history and problem list.  ? ?Objective:  ? ?Vitals:  ? 05/24/21 1611  ?BP: 108/69  ?Pulse: 90  ?Weight: 191 lb (86.6 kg)  ? ? ?Fetal Status: Fetal Heart Rate (bpm): 128   Movement: Present    ? ?General:  Alert, oriented and cooperative. Patient is in no acute distress.  ?Skin: Skin is warm and dry. No rash noted.   ?Cardiovascular: Normal heart rate noted  ?Respiratory: Normal respiratory effort, no problems with respiration noted  ?Abdomen: Soft, gravid, appropriate for gestational age.  Pain/Pressure: Absent     ?Pelvic: Cervical exam deferred        ?Extremities: Normal range of motion.  Edema: None  ?Mental Status: Normal mood and affect. Normal behavior. Normal judgment and thought content.  ? ?Assessment and Plan:  ?Pregnancy: [id number] 356w4d1. Diet controlled gestational diabetes mellitus (GDM), antepartum ?Reviewed CBGs: fastings are 50% normal with occasional elevations, most meals normal or borderline. May need medication next visit.  ?Growth 4/11: 88%ile, AC 38, HC 35, AFI 19 ? ?2. Thrombocytopenia affecting  pregnancy ([place]?Recheck cbc today ? ?3. Antepartum multigravida of advanced maternal age ? ?4. Supervision of high risk pregnancy, antepartum ?Cultures next time ? ?5. H/O: C-section ?Scheduled for repeat - didn't see in her appts, but message sent to JaJordano confirm and let pt know time.  ? ?6. Iron deficiency anemia during pregnancy ? ?Preterm labor symptoms and general obstetric precautions including but not limited to vaginal bleeding, contractions, leaking of fluid and fetal movement were reviewed in detail with the patient. ?Please refer to After Visit Summary for other counseling recommendations.  ? ?Return in about 2 weeks (around 06/07/2021) for OB VISIT, MD only. ? ?Future Appointments  ?Date Time Provider [place]?05/31/2021  9:15 AM [facility]A NST [facility]  ?06/07/2021  1:15 PM [facility] NST [facility]  ?06/07/2021  3:15 PM BaGriffin BasilMD [facility]?06/14/2021  2:15 PM [facility] NST [facility]  ? ? ?PaRadene GunningMD ?

## 2021-05-23 ENCOUNTER — Ambulatory Visit: Payer: Medicaid Other | Attending: Obstetrics

## 2021-05-23 ENCOUNTER — Other Ambulatory Visit: Payer: Self-pay | Admitting: Obstetrics

## 2021-05-23 ENCOUNTER — Ambulatory Visit: Payer: Medicaid Other | Admitting: *Deleted

## 2021-05-23 VITALS — BP 116/71 | HR 92

## 2021-05-23 DIAGNOSIS — O09523 Supervision of elderly multigravida, third trimester: Secondary | ICD-10-CM

## 2021-05-23 DIAGNOSIS — O99213 Obesity complicating pregnancy, third trimester: Secondary | ICD-10-CM

## 2021-05-23 DIAGNOSIS — O34219 Maternal care for unspecified type scar from previous cesarean delivery: Secondary | ICD-10-CM

## 2021-05-23 DIAGNOSIS — Z3A35 35 weeks gestation of pregnancy: Secondary | ICD-10-CM

## 2021-05-23 DIAGNOSIS — O24410 Gestational diabetes mellitus in pregnancy, diet controlled: Secondary | ICD-10-CM

## 2021-05-23 DIAGNOSIS — Z6841 Body Mass Index (BMI) 40.0 and over, adult: Secondary | ICD-10-CM | POA: Diagnosis present

## 2021-05-24 ENCOUNTER — Encounter: Payer: Self-pay | Admitting: Obstetrics and Gynecology

## 2021-05-24 ENCOUNTER — Ambulatory Visit (INDEPENDENT_AMBULATORY_CARE_PROVIDER_SITE_OTHER): Payer: Medicaid Other | Admitting: Obstetrics and Gynecology

## 2021-05-24 VITALS — BP 108/69 | HR 90 | Wt 191.0 lb

## 2021-05-24 DIAGNOSIS — D696 Thrombocytopenia, unspecified: Secondary | ICD-10-CM

## 2021-05-24 DIAGNOSIS — O0990 Supervision of high risk pregnancy, unspecified, unspecified trimester: Secondary | ICD-10-CM

## 2021-05-24 DIAGNOSIS — O99119 Other diseases of the blood and blood-forming organs and certain disorders involving the immune mechanism complicating pregnancy, unspecified trimester: Secondary | ICD-10-CM

## 2021-05-24 DIAGNOSIS — D509 Iron deficiency anemia, unspecified: Secondary | ICD-10-CM

## 2021-05-24 DIAGNOSIS — Z98891 History of uterine scar from previous surgery: Secondary | ICD-10-CM

## 2021-05-24 DIAGNOSIS — O24410 Gestational diabetes mellitus in pregnancy, diet controlled: Secondary | ICD-10-CM

## 2021-05-24 DIAGNOSIS — O09529 Supervision of elderly multigravida, unspecified trimester: Secondary | ICD-10-CM

## 2021-05-24 DIAGNOSIS — O99019 Anemia complicating pregnancy, unspecified trimester: Secondary | ICD-10-CM

## 2021-05-25 LAB — CBC
Hematocrit: 31.7 % — ABNORMAL LOW (ref 34.0–46.6)
Hemoglobin: 11.3 g/dL (ref 11.1–15.9)
MCH: 30.1 pg (ref 26.6–33.0)
MCHC: 35.6 g/dL (ref 31.5–35.7)
MCV: 84 fL (ref 79–97)
Platelets: 103 10*3/uL — ABNORMAL LOW (ref 150–450)
RBC: 3.76 x10E6/uL — ABNORMAL LOW (ref 3.77–5.28)
RDW: 12.7 % (ref 11.7–15.4)
WBC: 4.9 10*3/uL (ref 3.4–10.8)

## 2021-05-27 ENCOUNTER — Inpatient Hospital Stay (HOSPITAL_COMMUNITY)
Admission: AD | Admit: 2021-05-27 | Discharge: 2021-05-30 | DRG: 787 | Disposition: A | Discharge status: Home / Self Care | Payer: Medicaid Other | Attending: Obstetrics & Gynecology | Admitting: Obstetrics & Gynecology

## 2021-05-27 ENCOUNTER — Inpatient Hospital Stay (HOSPITAL_COMMUNITY): Payer: Medicaid Other | Admitting: Certified Registered"

## 2021-05-27 ENCOUNTER — Encounter (HOSPITAL_COMMUNITY): Payer: Self-pay | Admitting: Obstetrics & Gynecology

## 2021-05-27 ENCOUNTER — Other Ambulatory Visit: Payer: Self-pay

## 2021-05-27 ENCOUNTER — Encounter (HOSPITAL_COMMUNITY)
Admission: AD | Disposition: A | Discharge status: Home / Self Care | Payer: Self-pay | Source: Home / Self Care | Attending: Obstetrics & Gynecology

## 2021-05-27 DIAGNOSIS — Z3A36 36 weeks gestation of pregnancy: Secondary | ICD-10-CM | POA: Diagnosis not present

## 2021-05-27 DIAGNOSIS — D62 Acute posthemorrhagic anemia: Secondary | ICD-10-CM | POA: Diagnosis not present

## 2021-05-27 DIAGNOSIS — O34211 Maternal care for low transverse scar from previous cesarean delivery: Secondary | ICD-10-CM

## 2021-05-27 DIAGNOSIS — Z98891 History of uterine scar from previous surgery: Secondary | ICD-10-CM

## 2021-05-27 DIAGNOSIS — Z641 Problems related to multiparity: Secondary | ICD-10-CM

## 2021-05-27 DIAGNOSIS — O24419 Gestational diabetes mellitus in pregnancy, unspecified control: Secondary | ICD-10-CM | POA: Diagnosis present

## 2021-05-27 DIAGNOSIS — O09529 Supervision of elderly multigravida, unspecified trimester: Secondary | ICD-10-CM

## 2021-05-27 DIAGNOSIS — D6959 Other secondary thrombocytopenia: Secondary | ICD-10-CM | POA: Diagnosis present

## 2021-05-27 DIAGNOSIS — O3413 Maternal care for benign tumor of corpus uteri, third trimester: Secondary | ICD-10-CM | POA: Diagnosis present

## 2021-05-27 DIAGNOSIS — D259 Leiomyoma of uterus, unspecified: Secondary | ICD-10-CM | POA: Diagnosis present

## 2021-05-27 DIAGNOSIS — D509 Iron deficiency anemia, unspecified: Secondary | ICD-10-CM | POA: Diagnosis present

## 2021-05-27 DIAGNOSIS — D696 Thrombocytopenia, unspecified: Secondary | ICD-10-CM | POA: Diagnosis present

## 2021-05-27 DIAGNOSIS — D219 Benign neoplasm of connective and other soft tissue, unspecified: Secondary | ICD-10-CM | POA: Diagnosis present

## 2021-05-27 DIAGNOSIS — O42919 Preterm premature rupture of membranes, unspecified as to length of time between rupture and onset of labor, unspecified trimester: Secondary | ICD-10-CM | POA: Diagnosis not present

## 2021-05-27 DIAGNOSIS — O24429 Gestational diabetes mellitus in childbirth, unspecified control: Secondary | ICD-10-CM | POA: Diagnosis present

## 2021-05-27 DIAGNOSIS — O42913 Preterm premature rupture of membranes, unspecified as to length of time between rupture and onset of labor, third trimester: Principal | ICD-10-CM | POA: Diagnosis present

## 2021-05-27 DIAGNOSIS — O9912 Other diseases of the blood and blood-forming organs and certain disorders involving the immune mechanism complicating childbirth: Secondary | ICD-10-CM | POA: Diagnosis present

## 2021-05-27 DIAGNOSIS — O9081 Anemia of the puerperium: Secondary | ICD-10-CM | POA: Diagnosis not present

## 2021-05-27 DIAGNOSIS — O99119 Other diseases of the blood and blood-forming organs and certain disorders involving the immune mechanism complicating pregnancy, unspecified trimester: Secondary | ICD-10-CM | POA: Diagnosis present

## 2021-05-27 DIAGNOSIS — O0990 Supervision of high risk pregnancy, unspecified, unspecified trimester: Secondary | ICD-10-CM

## 2021-05-27 LAB — PREPARE RBC (CROSSMATCH)

## 2021-05-27 LAB — CBC
HCT: 30.6 % — ABNORMAL LOW (ref 36.0–46.0)
Hemoglobin: 10.4 g/dL — ABNORMAL LOW (ref 12.0–15.0)
MCH: 29.7 pg (ref 26.0–34.0)
MCHC: 34 g/dL (ref 30.0–36.0)
MCV: 87.4 fL (ref 80.0–100.0)
Platelets: 127 10*3/uL — ABNORMAL LOW (ref 150–400)
RBC: 3.5 MIL/uL — ABNORMAL LOW (ref 3.87–5.11)
RDW: 13 % (ref 11.5–15.5)
WBC: 4.6 10*3/uL (ref 4.0–10.5)
nRBC: 0 % (ref 0.0–0.2)

## 2021-05-27 LAB — GLUCOSE, CAPILLARY
Glucose-Capillary: 83 mg/dL (ref 70–99)
Glucose-Capillary: 86 mg/dL (ref 70–99)

## 2021-05-27 LAB — POCT FERN TEST: POCT Fern Test: POSITIVE

## 2021-05-27 SURGERY — Surgical Case
Anesthesia: Spinal | Wound class: Clean Contaminated

## 2021-05-27 MED ORDER — OXYTOCIN-SODIUM CHLORIDE 30-0.9 UT/500ML-% IV SOLN: 2.5000 [IU]/h | INTRAVENOUS | Status: DC

## 2021-05-27 MED ORDER — OXYTOCIN-SODIUM CHLORIDE 30-0.9 UT/500ML-% IV SOLN
INTRAVENOUS | Status: AC
  Filled 2021-05-27: qty 500

## 2021-05-27 MED ORDER — PHENYLEPHRINE 40 MCG/ML (10ML) SYRINGE FOR IV PUSH (FOR BLOOD PRESSURE SUPPORT)
PREFILLED_SYRINGE | INTRAVENOUS | Status: AC
  Filled 2021-05-27: qty 10

## 2021-05-27 MED ORDER — LACTATED RINGERS IV SOLN: INTRAVENOUS | Status: DC

## 2021-05-27 MED ORDER — OXYTOCIN-SODIUM CHLORIDE 30-0.9 UT/500ML-% IV SOLN
INTRAVENOUS | Status: DC | PRN
  Administered 2021-05-27: 300 mL via INTRAVENOUS

## 2021-05-27 MED ORDER — SODIUM CHLORIDE 0.9 % IR SOLN
Status: DC | PRN
  Administered 2021-05-27: 1

## 2021-05-27 MED ORDER — COCONUT OIL OIL
1.0000 | TOPICAL_OIL | Status: DC | PRN
Start: 2021-05-27 — End: 2021-05-30

## 2021-05-27 MED ORDER — KETOROLAC TROMETHAMINE 30 MG/ML IJ SOLN
30.0000 mg | Freq: Four times a day (QID) | INTRAMUSCULAR | Status: AC
  Administered 2021-05-27 – 2021-05-28 (×3): 30 mg via INTRAVENOUS
  Filled 2021-05-27 (×3): qty 1

## 2021-05-27 MED ORDER — CEFAZOLIN SODIUM-DEXTROSE 2-4 GM/100ML-% IV SOLN
2.0000 g | INTRAVENOUS | Status: AC
  Administered 2021-05-27: 2 g via INTRAVENOUS
  Filled 2021-05-27: qty 100

## 2021-05-27 MED ORDER — SENNOSIDES-DOCUSATE SODIUM 8.6-50 MG PO TABS
2.0000 | ORAL_TABLET | Freq: Every day | ORAL | Status: DC
  Administered 2021-05-28 – 2021-05-30 (×3): 2 via ORAL
  Filled 2021-05-27 (×3): qty 2

## 2021-05-27 MED ORDER — MENTHOL 3 MG MT LOZG: 1.0000 | LOZENGE | OROMUCOSAL | Status: DC | PRN

## 2021-05-27 MED ORDER — DIPHENHYDRAMINE HCL 50 MG/ML IJ SOLN: 12.5000 mg | INTRAMUSCULAR | Status: DC | PRN

## 2021-05-27 MED ORDER — ACETAMINOPHEN 325 MG PO TABS: 650.0000 mg | ORAL_TABLET | ORAL | Status: DC | PRN

## 2021-05-27 MED ORDER — SIMETHICONE 80 MG PO CHEW
80.0000 mg | CHEWABLE_TABLET | ORAL | Status: DC | PRN
  Administered 2021-05-30: 80 mg via ORAL

## 2021-05-27 MED ORDER — LIDOCAINE HCL (PF) 1 % IJ SOLN: 30.0000 mL | INTRAMUSCULAR | Status: DC | PRN

## 2021-05-27 MED ORDER — SODIUM CHLORIDE 0.9 % IV SOLN
500.0000 mg | INTRAVENOUS | Status: AC
  Administered 2021-05-27: 500 mg via INTRAVENOUS
  Filled 2021-05-27: qty 5

## 2021-05-27 MED ORDER — NALOXONE HCL 4 MG/10ML IJ SOLN
1.0000 ug/kg/h | INTRAVENOUS | Status: DC | PRN
  Filled 2021-05-27: qty 5

## 2021-05-27 MED ORDER — MEDROXYPROGESTERONE ACETATE 150 MG/ML IM SUSP: 150.0000 mg | INTRAMUSCULAR | Status: DC | PRN

## 2021-05-27 MED ORDER — FAMOTIDINE IN NACL 20-0.9 MG/50ML-% IV SOLN
20.0000 mg | Freq: Once | INTRAVENOUS | Status: AC
  Administered 2021-05-27: 20 mg via INTRAVENOUS
  Filled 2021-05-27: qty 50

## 2021-05-27 MED ORDER — MORPHINE SULFATE (PF) 0.5 MG/ML IJ SOLN
INTRAMUSCULAR | Status: DC | PRN
  Administered 2021-05-27: 150 ug via INTRATHECAL

## 2021-05-27 MED ORDER — FENTANYL CITRATE (PF) 100 MCG/2ML IJ SOLN
INTRAMUSCULAR | Status: DC | PRN
Start: 2021-05-27 — End: 2021-05-27
  Administered 2021-05-27: 15 ug via INTRATHECAL

## 2021-05-27 MED ORDER — MEPERIDINE HCL 25 MG/ML IJ SOLN: 6.2500 mg | INTRAMUSCULAR | Status: DC | PRN

## 2021-05-27 MED ORDER — SODIUM CHLORIDE 0.9% IV SOLUTION: Freq: Once | INTRAVENOUS | Status: DC

## 2021-05-27 MED ORDER — PHENYLEPHRINE HCL-NACL 20-0.9 MG/250ML-% IV SOLN
INTRAVENOUS | Status: AC
  Filled 2021-05-27: qty 250

## 2021-05-27 MED ORDER — KETOROLAC TROMETHAMINE 30 MG/ML IJ SOLN: 30.0000 mg | Freq: Once | INTRAMUSCULAR | Status: DC | PRN

## 2021-05-27 MED ORDER — ONDANSETRON HCL 4 MG/2ML IJ SOLN: 4.0000 mg | Freq: Four times a day (QID) | INTRAMUSCULAR | Status: DC | PRN

## 2021-05-27 MED ORDER — STERILE WATER FOR IRRIGATION IR SOLN
Status: DC | PRN
  Administered 2021-05-27: 1

## 2021-05-27 MED ORDER — OXYCODONE-ACETAMINOPHEN 5-325 MG PO TABS: 1.0000 | ORAL_TABLET | ORAL | Status: DC | PRN

## 2021-05-27 MED ORDER — SIMETHICONE 80 MG PO CHEW
80.0000 mg | CHEWABLE_TABLET | Freq: Three times a day (TID) | ORAL | Status: DC
  Administered 2021-05-27 – 2021-05-29 (×6): 80 mg via ORAL
  Filled 2021-05-27 (×7): qty 1

## 2021-05-27 MED ORDER — ONDANSETRON HCL 4 MG/2ML IJ SOLN
INTRAMUSCULAR | Status: AC
  Filled 2021-05-27: qty 2

## 2021-05-27 MED ORDER — SOD CITRATE-CITRIC ACID 500-334 MG/5ML PO SOLN: 30.0000 mL | ORAL | Status: DC | PRN

## 2021-05-27 MED ORDER — PHENYLEPHRINE HCL-NACL 20-0.9 MG/250ML-% IV SOLN
INTRAVENOUS | Status: DC | PRN
  Administered 2021-05-27: 40 ug/min via INTRAVENOUS

## 2021-05-27 MED ORDER — DIBUCAINE (PERIANAL) 1 % EX OINT: 1.0000 "application " | TOPICAL_OINTMENT | CUTANEOUS | Status: DC | PRN

## 2021-05-27 MED ORDER — SCOPOLAMINE 1 MG/3DAYS TD PT72
1.0000 | MEDICATED_PATCH | Freq: Once | TRANSDERMAL | Status: AC
  Administered 2021-05-27: 1.5 mg via TRANSDERMAL
  Filled 2021-05-27: qty 1

## 2021-05-27 MED ORDER — NALOXONE HCL 0.4 MG/ML IJ SOLN: 0.4000 mg | INTRAMUSCULAR | Status: DC | PRN

## 2021-05-27 MED ORDER — DIPHENHYDRAMINE HCL 25 MG PO CAPS: 25.0000 mg | ORAL_CAPSULE | Freq: Four times a day (QID) | ORAL | Status: DC | PRN

## 2021-05-27 MED ORDER — OXYCODONE HCL 5 MG PO TABS
5.0000 mg | ORAL_TABLET | ORAL | Status: DC | PRN
  Administered 2021-05-29: 10 mg via ORAL
  Administered 2021-05-29 – 2021-05-30 (×2): 5 mg via ORAL
  Filled 2021-05-27: qty 2
  Filled 2021-05-27 (×2): qty 1

## 2021-05-27 MED ORDER — FENTANYL CITRATE (PF) 100 MCG/2ML IJ SOLN
INTRAMUSCULAR | Status: AC
  Filled 2021-05-27: qty 2

## 2021-05-27 MED ORDER — PHENYLEPHRINE 40 MCG/ML (10ML) SYRINGE FOR IV PUSH (FOR BLOOD PRESSURE SUPPORT)
PREFILLED_SYRINGE | INTRAVENOUS | Status: DC | PRN
Start: 2021-05-27 — End: 2021-05-27
  Administered 2021-05-27: 40 ug via INTRAVENOUS

## 2021-05-27 MED ORDER — DIPHENHYDRAMINE HCL 25 MG PO CAPS: 25.0000 mg | ORAL_CAPSULE | ORAL | Status: DC | PRN

## 2021-05-27 MED ORDER — GLYCOPYRROLATE PF 0.2 MG/ML IJ SOSY
PREFILLED_SYRINGE | INTRAMUSCULAR | Status: AC
  Filled 2021-05-27: qty 1

## 2021-05-27 MED ORDER — MEASLES, MUMPS & RUBELLA VAC IJ SOLR: 0.5000 mL | Freq: Once | INTRAMUSCULAR | Status: DC

## 2021-05-27 MED ORDER — EPHEDRINE 5 MG/ML INJ
INTRAVENOUS | Status: AC
  Filled 2021-05-27: qty 5

## 2021-05-27 MED ORDER — OXYCODONE-ACETAMINOPHEN 5-325 MG PO TABS: 2.0000 | ORAL_TABLET | ORAL | Status: DC | PRN

## 2021-05-27 MED ORDER — OXYTOCIN-SODIUM CHLORIDE 30-0.9 UT/500ML-% IV SOLN: 2.5000 [IU]/h | INTRAVENOUS | Status: AC

## 2021-05-27 MED ORDER — ENOXAPARIN SODIUM 40 MG/0.4ML IJ SOSY
40.0000 mg | PREFILLED_SYRINGE | INTRAMUSCULAR | Status: DC
  Administered 2021-05-28 – 2021-05-29 (×2): 40 mg via SUBCUTANEOUS
  Filled 2021-05-27 (×2): qty 0.4

## 2021-05-27 MED ORDER — MORPHINE SULFATE (PF) 0.5 MG/ML IJ SOLN
INTRAMUSCULAR | Status: AC
  Filled 2021-05-27: qty 10

## 2021-05-27 MED ORDER — SODIUM CHLORIDE 0.9 % IV SOLN
INTRAVENOUS | Status: AC
  Filled 2021-05-27: qty 5

## 2021-05-27 MED ORDER — TETANUS-DIPHTH-ACELL PERTUSSIS 5-2.5-18.5 LF-MCG/0.5 IM SUSY: 0.5000 mL | PREFILLED_SYRINGE | Freq: Once | INTRAMUSCULAR | Status: DC

## 2021-05-27 MED ORDER — ONDANSETRON HCL 4 MG/2ML IJ SOLN: 4.0000 mg | Freq: Three times a day (TID) | INTRAMUSCULAR | Status: DC | PRN

## 2021-05-27 MED ORDER — FLEET ENEMA 7-19 GM/118ML RE ENEM: 1.0000 | ENEMA | RECTAL | Status: DC | PRN

## 2021-05-27 MED ORDER — SODIUM CHLORIDE 0.9% FLUSH: 3.0000 mL | INTRAVENOUS | Status: DC | PRN

## 2021-05-27 MED ORDER — WITCH HAZEL-GLYCERIN EX PADS: 1.0000 "application " | MEDICATED_PAD | CUTANEOUS | Status: DC | PRN

## 2021-05-27 MED ORDER — PRENATAL MULTIVITAMIN CH
1.0000 | ORAL_TABLET | Freq: Every day | ORAL | Status: DC
  Administered 2021-05-28 – 2021-05-30 (×3): 1 via ORAL
  Filled 2021-05-27 (×3): qty 1

## 2021-05-27 MED ORDER — LACTATED RINGERS IV BOLUS
1000.0000 mL | Freq: Once | INTRAVENOUS | Status: AC
Start: 2021-05-27 — End: 2021-05-27
  Administered 2021-05-27: 1000 mL via INTRAVENOUS

## 2021-05-27 MED ORDER — ACETAMINOPHEN 325 MG PO TABS
650.0000 mg | ORAL_TABLET | ORAL | Status: DC | PRN
  Administered 2021-05-29 – 2021-05-30 (×4): 650 mg via ORAL
  Filled 2021-05-27 (×4): qty 2

## 2021-05-27 MED ORDER — OXYTOCIN BOLUS FROM INFUSION: 333.0000 mL | Freq: Once | INTRAVENOUS | Status: DC

## 2021-05-27 MED ORDER — POVIDONE-IODINE 10 % EX SWAB: 2.0000 "application " | Freq: Once | CUTANEOUS | Status: DC

## 2021-05-27 MED ORDER — SOD CITRATE-CITRIC ACID 500-334 MG/5ML PO SOLN: 30.0000 mL | ORAL | Status: DC

## 2021-05-27 MED ORDER — ONDANSETRON HCL 4 MG/2ML IJ SOLN
INTRAMUSCULAR | Status: DC | PRN
  Administered 2021-05-27: 4 mg via INTRAVENOUS

## 2021-05-27 MED ORDER — LACTATED RINGERS IV SOLN: 500.0000 mL | INTRAVENOUS | Status: DC | PRN

## 2021-05-27 MED ORDER — IBUPROFEN 600 MG PO TABS
600.0000 mg | ORAL_TABLET | Freq: Four times a day (QID) | ORAL | Status: DC
  Administered 2021-05-28 – 2021-05-30 (×9): 600 mg via ORAL
  Filled 2021-05-27 (×9): qty 1

## 2021-05-27 MED ORDER — BUPIVACAINE IN DEXTROSE 0.75-8.25 % IT SOLN
INTRATHECAL | Status: DC | PRN
  Administered 2021-05-27: 1.6 mL via INTRATHECAL

## 2021-05-27 SURGICAL SUPPLY — 38 items
BENZOIN TINCTURE PRP APPL 2/3 (GAUZE/BANDAGES/DRESSINGS) ×2 IMPLANT
CHLORAPREP W/TINT 26ML (MISCELLANEOUS) ×4 IMPLANT
CLAMP CORD UMBIL (MISCELLANEOUS) ×2 IMPLANT
CLOSURE STERI-STRIP 1/4X4 (GAUZE/BANDAGES/DRESSINGS) ×1 IMPLANT
CLOTH BEACON ORANGE TIMEOUT ST (SAFETY) ×2 IMPLANT
DERMABOND ADVANCED (GAUZE/BANDAGES/DRESSINGS)
DERMABOND ADVANCED .7 DNX12 (GAUZE/BANDAGES/DRESSINGS) IMPLANT
DRSG OPSITE POSTOP 4X10 (GAUZE/BANDAGES/DRESSINGS) ×2 IMPLANT
ELECT REM PT RETURN 9FT ADLT (ELECTROSURGICAL) ×2
ELECTRODE REM PT RTRN 9FT ADLT (ELECTROSURGICAL) ×1 IMPLANT
EXTRACTOR VACUUM KIWI (MISCELLANEOUS) IMPLANT
GLOVE BIOGEL PI IND STRL 7.0 (GLOVE) ×3 IMPLANT
GLOVE BIOGEL PI INDICATOR 7.0 (GLOVE) ×3
GLOVE ECLIPSE 6.5 STRL STRAW (GLOVE) ×2 IMPLANT
GOWN STRL REUS W/TWL LRG LVL3 (GOWN DISPOSABLE) ×6 IMPLANT
KIT ABG SYR 3ML LUER SLIP (SYRINGE) IMPLANT
NDL HYPO 25X5/8 SAFETYGLIDE (NEEDLE) IMPLANT
NEEDLE HYPO 25X5/8 SAFETYGLIDE (NEEDLE) IMPLANT
NS IRRIG 1000ML POUR BTL (IV SOLUTION) ×2 IMPLANT
PACK C SECTION WH (CUSTOM PROCEDURE TRAY) ×2 IMPLANT
PAD ABD 7.5X8 STRL (GAUZE/BANDAGES/DRESSINGS) ×2 IMPLANT
PAD OB MATERNITY 4.3X12.25 (PERSONAL CARE ITEMS) ×2 IMPLANT
RTRCTR C-SECT PINK 25CM LRG (MISCELLANEOUS) ×2 IMPLANT
STRIP CLOSURE SKIN 1/2X4 (GAUZE/BANDAGES/DRESSINGS) ×2 IMPLANT
SUT PLAIN 0 NONE (SUTURE) IMPLANT
SUT PLAIN 2 0 (SUTURE) ×4
SUT PLAIN 2 0 XLH (SUTURE) IMPLANT
SUT PLAIN ABS 2-0 CT1 27XMFL (SUTURE) IMPLANT
SUT VIC AB 0 CT1 27 (SUTURE) ×4
SUT VIC AB 0 CT1 27XBRD ANBCTR (SUTURE) ×2 IMPLANT
SUT VIC AB 0 CTX 36 (SUTURE) ×6
SUT VIC AB 0 CTX36XBRD ANBCTRL (SUTURE) ×3 IMPLANT
SUT VIC AB 2-0 CT1 27 (SUTURE) ×2
SUT VIC AB 2-0 CT1 TAPERPNT 27 (SUTURE) ×1 IMPLANT
SUT VIC AB 4-0 KS 27 (SUTURE) ×2 IMPLANT
TOWEL OR 17X24 6PK STRL BLUE (TOWEL DISPOSABLE) ×2 IMPLANT
TRAY FOLEY W/BAG SLVR 14FR LF (SET/KITS/TRAYS/PACK) IMPLANT
WATER STERILE IRR 1000ML POUR (IV SOLUTION) ×2 IMPLANT

## 2021-05-27 NOTE — Op Note (Signed)
Woodroe Chen ? ?PROCEDURE DATE: 05/27/2021 ? ?PREOPERATIVE DIAGNOSES: Intrauterine pregnancy at 57w0dweeks gestation; history of cesarean section x2; PPROM ? ?POSTOPERATIVE DIAGNOSES: The same ? ?PROCEDURE: Repeat Low Transverse Cesarean Section ? ?SURGEON:  Dr. JJanyth Pupa ? ?ASSISTANT:  Dr. CVilma Meckel ? ?ANESTHESIOLOGY TEAM: Anesthesiologist: SDarral Dash DO ?CRNA: WAdalberto Ill CRNA ? ?INDICATIONS: Carol TEAGARDENis a 41y.o. [id number] 341w0dere for cesarean section secondary to the indications listed under preoperative diagnoses; please see preoperative note for further details.  The risks of cesarean section were discussed with the patient including but were not limited to: bleeding which may require transfusion or reoperation; infection which may require antibiotics; injury to bowel, bladder, ureters or other surrounding organs; injury to the fetus; need for additional procedures including hysterectomy in the event of a life-threatening hemorrhage; placental abnormalities wth subsequent pregnancies, incisional problems, thromboembolic phenomenon and other postoperative/anesthesia complications.   The patient concurred with the proposed plan, giving informed written consent for the procedure.   ? ?FINDINGS:  Viable female infant in cephalic presentation.  Apgars 9 and 9.  Clear amniotic fluid.  Intact placenta, three vessel cord.  Normal uterus, fallopian tubes and ovaries bilaterally. ? ?ANESTHESIA: Spinal  ?INTRAVENOUS FLUIDS: 1000 ml   ?ESTIMATED BLOOD LOSS: 479 ml ?URINE OUTPUT:  1100 ml ?SPECIMENS: Placenta sent to L&D  ?COMPLICATIONS: None immediate ? ?PROCEDURE IN DETAIL:   ?The patient preoperatively received intravenous antibiotics and had sequential compression devices applied to her lower extremities.  She was then taken to the operating room where spinal anesthesia was administered and was found to be adequate. She was then placed in a dorsal supine position with a leftward  tilt, and prepped and draped in a sterile manner.  A foley catheter was placed into her bladder and attached to constant gravity.   ? ?After an adequate timeout was performed, a Pfannenstiel skin incision was made with a scalpel just above her preexisting scar and carried through to the underlying layer of fascia. The fascia was incised in the midline, and this incision was extended bilaterally using the Mayo scissors.  Kocher clamps were applied to the inferior aspect of the fascial incision and the underlying rectus muscles were dissected off bluntly and sharply.  A similar process was carried out on the superior aspect of the fascial incision. The rectus muscles were separated in the midline both bluntly and sharply and the peritoneum was entered bluntly. The Alexis self-retaining retractor was introduced into the abdominal cavity.   ? ?Attention was turned to the lower uterine segment where a low transverse hysterotomy was made with a scalpel and extended bilaterally bluntly.  The infant was successfully delivered, the cord was clamped and cut after one minute, and the infant was handed over to the awaiting neonatology team. Uterine massage was then administered, and the placenta delivered intact with a three-vessel cord. The uterus was then cleared of clots and debris.   ? ?The hysterotomy was closed with 0 Vicryl in a running locked fashion.  Figure-of-eight 0 Vicryl serosal stitches were placed to help with hemostasis.  The pelvis was cleared of all clot and debris. Hemostasis was confirmed on all surfaces.  The retractor was removed.   ? ?The fascia was then closed using 0 Vicryl in a running fashion.  The subcutaneous layer was irrigated, re-approximated with a 2-0 plain gut running stitch, and the skin was closed with a 4-0 Vicryl subcuticular stitch. The patient tolerated the procedure  well. Sponge, instrument and needle counts were correct x 3.  She was taken to the recovery room in stable condition.   ? ?Vilma Meckel, MD ?[facility] Fellow  Faculty Practice  ? ? ?

## 2021-05-27 NOTE — Progress Notes (Addendum)
.  Carol Walls is a 41 y.o. at 45w0dhere in MAU reporting: leaking of fluid. Patient denies contractions, vaginal bleeding. Patient stated that she has not felt the baby move since she woke up this morning. ?Patient declined interpreter, she stated that she can communicate effectively in eMaybee  ?Patient stated that her blood sugar 131 this am ? ?LMP: 09/17/2020 ?Onset of complaint: 05/27/21 @ 621am  ?Pain score: denies pain ?Vitals:  ?BP 131/89 ?HR 87 ?SAT 98%  ?98.4 temp ?Resp 16  ?FHT:135 ?Lab orders placed from triage:  FMaryann Alar? ?

## 2021-05-27 NOTE — Anesthesia Preprocedure Evaluation (Addendum)
Anesthesia Evaluation  ?Patient identified by MRN, date of birth, ID band ?Patient awake ? ? ? ?Reviewed: ?Allergy & Precautions, NPO status , Patient's Chart, lab work & pertinent test results ? ?Airway ?Mallampati: II ? ?TM Distance: >3 FB ?Neck ROM: Full ? ? ? Dental ?no notable dental hx. ? ?  ?Pulmonary ?neg pulmonary ROS,  ?  ?Pulmonary exam normal ? ? ? ? ? ? ? Cardiovascular ?negative cardio ROS ? ? ?Rhythm:Regular Rate:Normal ? ? ?  ?Neuro/Psych ?negative neurological ROS ? negative psych ROS  ? GI/Hepatic ?negative GI ROS, Neg liver ROS,   ?Endo/Other  ?diabetes, Gestational ? Renal/GU ?negative Renal ROS  ?negative genitourinary ?  ?Musculoskeletal ?negative musculoskeletal ROS ?(+)  ? Abdominal ?Normal abdominal exam  (+)   ?Peds ? Hematology ? ?(+) Blood dyscrasia, anemia ,   ?Anesthesia Other Findings ? ? Reproductive/Obstetrics ?(+) Pregnancy ?PPROM, repeat C/S ? ?  ? ? ? ? ? ? ? ? ? ? ? ? ? ?  ?  ? ? ? ? ? ? ? ?Anesthesia Physical ?Anesthesia Plan ? ?ASA: 2 ? ?Anesthesia Plan: Spinal  ? ?Post-op Pain Management:   ? ?Induction:  ? ?PONV Risk Score and Plan: 2 and Treatment may vary due to age or medical condition ? ?Airway Management Planned: Natural Airway, Simple Face Mask and Nasal Cannula ? ?Additional Equipment:  ? ?Intra-op Plan:  ? ?Post-operative Plan:  ? ?Informed Consent: I have reviewed the patients History and Physical, chart, labs and discussed the procedure including the risks, benefits and alternatives for the proposed anesthesia with the patient or authorized representative who has indicated his/her understanding and acceptance.  ? ? ? ?Dental advisory given ? ?Plan Discussed with: CRNA ? ?Anesthesia Plan Comments: (Lab Results ?     Component                Value               Date                 ?     WBC                      4.9                 05/24/2021           ?     HGB                      11.3                05/24/2021           ?     HCT                       31.7 (L)            05/24/2021           ?     MCV                      84                  05/24/2021           ?     PLT                      103 (L)  05/24/2021           ?Lab Results ?     Component                Value               Date                 ?     NA                       138                 12/27/2020           ?     K                        4.0                 12/27/2020           ?     CO2                      18 (L)              12/27/2020           ?     GLUCOSE                  116 (H)             12/27/2020           ?     BUN                      8                   12/27/2020           ?     CREATININE               0.51 (L)            12/27/2020           ?     CALCIUM                  9.8                 12/27/2020           ?     EGFR                     119                 12/27/2020           ?     GFRNONAA                 113                 12/25/2019          )  ? ? ? ? ? ?Anesthesia Quick Evaluation ? ?

## 2021-05-27 NOTE — Anesthesia Postprocedure Evaluation (Signed)
Anesthesia Post Note ? ?Patient: Carol Walls ? ?Procedure(s) Performed: CESAREAN SECTION ? ?  ? ?Patient location during evaluation: PACU ?Anesthesia Type: Spinal ?Level of consciousness: awake and alert ?Pain management: pain level controlled ?Vital Signs Assessment: post-procedure vital signs reviewed and stable ?Respiratory status: spontaneous breathing, nonlabored ventilation and respiratory function stable ?Cardiovascular status: stable and blood pressure returned to baseline ?Postop Assessment: no apparent nausea or vomiting ?Anesthetic complications: no ? ? ?No notable events documented. ? ?Last Vitals:  ?Vitals:  ? 05/27/21 1706 05/27/21 1814  ?BP: 119/75 111/73  ?Pulse: 89 97  ?Resp: 18 18  ?Temp: 36.4 ?C (!) 36.4 ?C  ?SpO2: 100% 98%  ?  ?Last Pain:  ?Vitals:  ? 05/27/21 1814  ?TempSrc: Oral  ?PainSc: 0-No pain  ? ? ?  ?  ?  ?  ?  ?  ? ?March Rummage [place] [facility]o ? ? ? ? ?

## 2021-05-27 NOTE — Anesthesia Procedure Notes (Signed)
Spinal ? ?Patient location during procedure: OR ?Start time: 05/27/2021 1:53 PM ?End time: 05/27/2021 1:55 PM ?Staffing ?Performed: anesthesiologist  ?Anesthesiologist: Darral Dash, DO ?Preanesthetic Checklist ?Completed: patient identified, IV checked, site marked, risks and benefits discussed, surgical consent, monitors and equipment checked, pre-op evaluation and timeout performed ?Spinal Block ?Patient position: sitting ?Prep: DuraPrep ?Patient monitoring: heart rate, cardiac monitor, continuous pulse ox and blood pressure ?Approach: midline ?Location: L4-5 ?Injection technique: single-shot ?Needle ?Needle type: Pencan  ?Needle gauge: 24 G ?Needle length: 10 cm ?Assessment ?Events: CSF return ?Additional Notes ?Patient identified. Risks/Benefits/Options discussed with patient including but not limited to bleeding, infection, nerve damage, paralysis, failed block, incomplete pain control, headache, blood pressure changes, nausea, vomiting, reactions to medications, itching and postpartum back pain. Confirmed with bedside nurse the patient's most recent platelet count. Confirmed with patient that they are not currently taking any anticoagulation, have any bleeding history or any family history of bleeding disorders. Patient expressed understanding and wished to proceed. All questions were answered. Sterile technique was used throughout the entire procedure. Please see nursing notes for vital signs. Warning signs of high block given to the patient including shortness of breath, tingling/numbness in hands, complete motor block, or any concerning symptoms with instructions to call for help. Patient was given instructions on fall risk and not to get out of bed. All questions and concerns addressed with instructions to call with any issues or inadequate analgesia.   ?  ? ? ? ?

## 2021-05-27 NOTE — H&P (Addendum)
Obstetric Preoperative History and Physical ? ?Carol Walls is a 41 y.o. [id number] with IUP at 73w0dpresenting due tor ruptured membranes around 620am.  Initially she was not feeling any contractions, but now she is noting occasional contraction. Denies vaginal bleeding.  +FM ? ?Last ate at 5am ? ?Cesarean Section Indication:  prior C-section x 2, PPROM ? ?Prenatal Course ?Source of Care: [facility]  with onset of care at 14 weeks ? ?Pregnancy complications or risks: ?-Prior C-section x 2 ?-Uterine fibroids- anterior mid 3.6x3x1.7 and anterior left 4.6x 3.7 ?-Gestational thrombocytopenia ? ?Patient Active Problem List  ? Diagnosis Date Noted  ? Previous cesarean section 025-Apr-2023 ? Normal labor 004-25-2023 ? [place] 05/08/2021  ? Carpal tunnel syndrome during pregnancy 04/10/2021  ? Iron deficiency anemia during pregnancy 03/14/2021  ? Gestational diabetes mellitus (GDM), antepartum 03/14/2021  ? Thrombocytopenia affecting pregnancy ([place] 03/14/2021  ? Advanced maternal age in multigravida 12/27/2020  ? H/O: C-section 12/27/2020  ? Fibroid 12/27/2020  ? Supervision of high risk pregnancy, antepartum 12/19/2020  ? Syncope 12/25/2019  ? Palpitation 12/25/2019  ? ?She plans to breastfeed ?Contraception: not sure consider either Nexplanon or IUD ? ?Prenatal labs and studies: ?ABO, Rh: --/--/PENDING (2024-04-250825) ?Antibody: PENDING (Apr 25, 20240825) ?Rubella: 2.19 (11/15 1556) ?RPR: Non Reactive (01/30 0831)  ?HBsAg: Negative (11/15 1556)  ?HIV: Non Reactive (01/30 0831)  ?GBS: n/a ?1 hr Glucola  normal ?Genetic screening normal ?Anatomy UKoreanormal ? ?Prenatal Transfer Tool  ?Maternal Diabetes: No ?Genetic Screening: not completed ?Maternal Ultrasounds/Referrals: Normal ?Fetal Ultrasounds or other Referrals:  None ?Maternal Substance Abuse:  No ?Significant Maternal Medications:  None ?Significant Maternal Lab Results: Other:  ? ?Past Medical History:  ?Diagnosis Date  ? Back pain   ? Fibroids   ? GDM (gestational diabetes  mellitus) 02/2021  ? ? ?Past Surgical History:  ?Procedure Laterality Date  ? CESAREAN SECTION    ? CESAREAN SECTION  12/06/2011  ? Procedure: CESAREAN SECTION;  Surgeon: BFrederico Hamman MD;  [facility];  Service: Obstetrics;  Laterality: N/A;  Repeat Cesarean Section with birth of baby A Boy @ 2125, Baby B Girl  @ 2127  ? ? ?OB History  ?Gravida Para Term Preterm AB Living  ?7 5 4 1 1 6   ?SAB IAB Ectopic Multiple Live Births  ?1 0 0 1 2  ?  ?# Outcome Date GA Lbr Len/2nd Weight Sex Delivery Anes PTL Lv  ?7 Current           ?6A Preterm 12/06/11 [id number] g M CS-LVertical Spinal  LIV  ?6B Preterm 12/06/11 3533w5d430 g F CS-LVertical Spinal  LIV  ?5 Term 2010    F CS-Unspec     ?4 SAB 2009 6w068w0d     ?3 Term 2005    M Vag-Spont     ?2 Term 2002    M Vag-Spont     ?1 TeDoctor Phillips ? ? ?Social History  ? ?Socioeconomic History  ? Marital status: Married  ?  Spouse name: Not on file  ? Number of children: Not on file  ? Years of education: Not on file  ? Highest education level: Not on file  ?Occupational History  ? Not on file  ?Tobacco Use  ? Smoking status: Never  ? Smokeless tobacco: Never  ?Vaping Use  ? Vaping Use: Never used  ?Substance and Sexual Activity  ?  Alcohol use: Never  ? Drug use: Never  ? Sexual activity: Yes  ?  Birth control/protection: None  ?Other Topics Concern  ? Not on file  ?Social History Narrative  ? Not on file  ? ?Social Determinants of Health  ? ?Financial Resource Strain: Not on file  ?Food Insecurity: No Food Insecurity  ? Worried About Charity fundraiser in the Last Year: Never true  ? Ran Out of Food in the Last Year: Never true  ?Transportation Needs: No Transportation Needs  ? Lack of Transportation (Medical): No  ? Lack of Transportation (Non-Medical): No  ?Physical Activity: Not on file  ?Stress: Not on file  ?Social Connections: Not on file  ? ? ?Family History  ?Problem Relation Age of Onset  ? Heart attack Brother 6  ? ? ?Medications Prior to  Admission  ?Medication Sig Dispense Refill Last Dose  ? aspirin EC 81 MG tablet Take 1 tablet (81 mg total) by mouth daily. Take after 12 weeks for prevention of preeclampsia later in pregnancy 300 tablet 2 05/26/2021  ? ferrous sulfate 325 (65 FE) MG tablet Take 1 tablet (325 mg total) by mouth every other day. 30 tablet 2 Past Week  ? Prenatal Vit-Fe Fumarate-FA (PREPLUS) 27-1 MG TABS Take 1 tablet by mouth daily. 90 tablet 3 05/26/2021  ? ACCU-CHEK GUIDE test strip TEST UPTO 4 TIMES DAILY 100 each 3   ? Accu-Chek Softclix Lancets lancets 1 each by Other route 4 (four) times daily. for testing 100 each 3   ? blood glucose meter kit and supplies KIT Dispense based on patient and insurance preference. Use up to four times daily as directed. 1 each 0   ? ? ?No Known Allergies ? ?Review of Systems: Pertinent items noted in HPI and remainder of comprehensive ROS otherwise negative. ? ?Physical Exam: ?BP 109/76   Pulse 90   LMP 09/17/2020   SpO2 99%  ? ?CONSTITUTIONAL: Well-developed, well-nourished female in no acute distress.  ?HENT:  Normocephalic, atraumatic, External right and left ear normal. Oropharynx is clear and moist ?EYES: Conjunctivae and EOM are normal. Pupils are equal, round, and reactive to light. No scleral icterus.  ?NECK: Normal range of motion, supple, no masses ?SKIN: Skin is warm and dry. No rash noted. Not diaphoretic. No erythema. No pallor. ?[place]: Alert and oriented to person, place, and time. Normal reflexes, muscle tone coordination. No cranial nerve deficit noted. ?PSYCHIATRIC: Normal mood and affect. Normal behavior. Normal judgment and thought content. ?CARDIOVASCULAR: Normal heart rate noted, regular rhythm ?RESPIRATORY: Effort and breath sounds normal, no problems with respiration noted ?ABDOMEN: Soft, nontender, nondistended, gravid. Well-healed Pfannenstiel incision. ?PELVIC: Deferred ?MUSCULOSKELETAL: Normal range of motion. No edema and no tenderness. 2+ distal pulses. ? ?FHR:  125, moderate variability, +accels, no decels ?Toco: irregular ?SVE: 3/40/-3, grossly ruptured ? ?Pertinent Labs/Studies:   ?Results for orders placed or performed during the hospital encounter of 05/27/21 (from the past 72 hour(s))  ?Fern Test     Status: Abnormal  ? Collection Time: 05/27/21  7:43 AM  ?Result Value Ref Range  ? POCT Fern Test Positive = ruptured amniotic membanes   ?Type and screen     Status: None (Preliminary result)  ? Collection Time: 05/27/21  8:25 AM  ?Result Value Ref Range  ? ABO/RH(D) PENDING   ? Antibody Screen PENDING   ? Sample Expiration    ?  05/30/2021,2359 ?Performed at [facility], [place] [address], [place], [place] [address] ?  ? ? ?  Assessment and Plan: TRINETTA ALEMU is a 41 y.o. [id number] at 65w0dbeing admitted for PPROM.  ?-FWB- Cat. I ?-Prior C-section x 2.  Initially was considering TOLAC if she had presented in labor on her own.  REviewed TOLAC vs repeat C-section.  Pt is concerned about risk during pushing and wishes to proceed with C-section ?-NPO ?-LR @ 125cc/hr ?-Ancef/Azithro to OR ? ?-The risks of surgery were discussed with the patient including but were not limited to: bleeding which may require transfusion or reoperation; infection which may require antibiotics; injury to bowel, bladder, ureters or other surrounding organs; injury to the fetus; need for additional procedures including hysterectomy in the event of a life-threatening hemorrhage; formation of adhesions; placental abnormalities wth subsequent pregnancies; incisional problems; thromboembolic phenomenon and other postoperative/anesthesia complications. The patient concurred with the proposed plan, giving informed written consent for the procedure. Patient has been NPO since last night she will remain NPO for procedure. Anesthesia and OR aware. Preoperative prophylactic antibiotics and SCDs ordered on call to the OR. To OR when ready.  ? ?JOtis Peak DO ?Obstetrician &Social research officer, government Faculty  Practice ?[facility] ?

## 2021-05-27 NOTE — Discharge Summary (Signed)
? ?  Postpartum Discharge Summary ? ?   ?Patient Name: Carol Walls ?DOB: Mar 31, 1980 ?MRN: [id number] ? ?Date of admission: 05/27/2021 ?Delivery date:05/27/2021  ?Delivering provider: Janyth Pupa  ?Date of discharge: 05/30/2021 ? ?Admitting diagnosis: Previous cesarean section [Z98.891] ?Normal labor [O80, Z37.9] ?Intrauterine pregnancy: [redacted]w[redacted]d    ?Secondary diagnosis:  Principal Problem: ?  Status post repeat low transverse cesarean section ?Active Problems: ?  Supervision of high risk pregnancy, antepartum ?  Advanced maternal age in multigravida ?  Fibroid ?  Iron deficiency anemia during pregnancy ?  Gestational diabetes mellitus (GDM), antepartum ?  [place] affecting [place] ([place] ?  [place] ?  Previous cesarean section ?  Normal labor ? ?Additional problems: Acute blood loss anemia; received IV Venofer    ?Discharge diagnosis: Preterm Pregnancy Delivered                                              ?Post partum procedures: None ?Augmentation: N/A ?Complications: None ? ?Hospital course: Sceduled C/S - 41y.o. yo [id number] admitted to the hospital 05/27/2021 for scheduled cesarean section with the following indication: Hx of CS x2, PPROM .  Delivery details are as follows:  ?Membrane Rupture Time/Date: 6:21 AM ,05/27/2021   ?Delivery Method:C-Section, Low Transverse  ?Details of operation can be found in separate operative note.  Patient had an uncomplicated postpartum course.  Her hemoglobin on POD#1 was 9.1, for which she received IV Venofer.  She is ambulating, tolerating a regular diet, passing flatus, and urinating well.  Her pain and bleeding are controlled.  She is breast and formula feeding well.  Patient is discharged home in stable condition on  05/30/21 ?       ?Newborn Data: ?Birth date:05/27/2021  ?Birth time:2:27 PM  ?Gender:Female  ?Living status:Living  ?Apgars:9 ,9  ?Weight:2850 g    ? ?Magnesium Sulfate received: No ?BMZ received: No ?Rhophylac: N/A ?MMR: N/A ?T-DaP:  Declined  ?Flu: Declined  ?Transfusion: No  ? ?Physical exam  ?Vitals:  ? 05/29/21 0531 05/29/21 1350 05/29/21 2047 05/30/21 0537  ?BP: (!) 91/59 116/72 97/63 110/82  ?Pulse: 95 (!) 101 88 97  ?Resp: 19 20 14 16   ?Temp:  98.4 ?F (36.9 ?C) 98.6 ?F (37 ?C) 98.7 ?F (37.1 ?C)  ?TempSrc:  Oral Oral Oral  ?SpO2: 100% 100% 100% 99%  ? ?General: alert, cooperative, and no distress ?Lochia: appropriate ?Uterine Fundus: firm and below umbilicus  ?Incision: healing well with no significant drainage, no significant erythema, dressing is clean, dry, and intact ?DVT Evaluation: no LE edema or calf tenderness to palpation  ? ?Labs: ?Lab Results  ?Component Value Date  ? WBC 5.4 05/28/2021  ? HGB 9.1 (L) 05/28/2021  ? HCT 25.6 (L) 05/28/2021  ? MCV 86.2 05/28/2021  ? PLT 122 (L) 05/28/2021  ? ? ?  Latest Ref Rng & Units 12/27/2020  ?  3:56 PM  ?CMP  ?Glucose 70 - 99 mg/dL 116    ?BUN 6 - 24 mg/dL 8    ?Creatinine 0.57 - 1.00 mg/dL 0.51    ?Sodium 134 - 144 mmol/L 138    ?Potassium 3.5 - 5.2 mmol/L 4.0    ?Chloride 96 - 106 mmol/L 104    ?CO2 20 - 29 mmol/L 18    ?Calcium 8.7 - 10.2 mg/dL 9.8    ?Total Protein 6.0 -  8.5 g/dL 6.9    ?Total Bilirubin 0.0 - 1.2 mg/dL <0.2    ?Alkaline Phos 44 - 121 IU/L 43    ?AST 0 - 40 IU/L 20    ?ALT 0 - 32 IU/L 27    ? ?Edinburgh Score: ? ?  05/30/2021  ?  7:24 AM  ?[name] Postnatal Depression Scale Screening Tool  ?I have been able to laugh and see the funny side of things. 0  ?I have looked forward with enjoyment to things. 0  ?I have blamed myself unnecessarily when things went wrong. 0  ?I have been anxious or worried for no good reason. 0  ?I have felt scared or panicky for no good reason. 0  ?Things have been getting on top of me. 0  ?I have been so unhappy that I have had difficulty sleeping. 1  ?I have felt sad or miserable. 1  ?I have been so unhappy that I have been crying. 0  ?The thought of harming myself has occurred to me. 0  ?Edinburgh Postnatal Depression Scale Total 2   ? ? ? ?After visit meds:  ?Allergies as of 05/30/2021   ?No Known Allergies ?  ? ?  ?Medication List  ?  ? ?STOP taking these medications   ? ?Accu-Chek Guide test strip ?Generic drug: glucose blood ?  ?Accu-Chek Softclix Lancets lancets ?  ?aspirin EC 81 MG tablet ?  ?blood glucose meter kit and supplies Kit ?  ? ?  ? ?TAKE these medications   ? ?acetaminophen 500 MG tablet ?Commonly known as: TYLENOL ?Take 2 tablets (1,000 mg total) by mouth every 8 (eight) hours as needed (pain). ?  ?ferrous sulfate 325 (65 FE) MG tablet ?Take 1 tablet (325 mg total) by mouth every other day. ?  ?ibuprofen 600 MG tablet ?Commonly known as: ADVIL ?Take 1 tablet (600 mg total) by mouth every 6 (six) hours as needed (pain). ?  ?oxyCODONE 5 MG immediate release tablet ?Commonly known as: Oxy IR/ROXICODONE ?Take 1 tablet (5 mg total) by mouth every 6 (six) hours as needed for severe pain or breakthrough pain. ?  ?PrePLUS 27-1 MG Tabs ?Take 1 tablet by mouth daily. ?  ? ?  ? ?  ?  ? ? ?  ?Discharge Care Instructions  ?(From admission, onward)  ?  ? ? ?  ? ?  Start     Ordered  ? 05/30/21 0000  Discharge wound care:       ?Comments: Remove dressing 5 days after your surgery date. You can then wash the area gently with soap and water and pat dry. You will have an incision check in about 1 week.  ? 05/30/21 0856  ? ?  ?  ? ?  ? ? ? ?Discharge home in stable condition ?Infant Feeding: Bottle and Breast ?Infant Disposition: home with mother ?Discharge instruction: per After Visit Summary and Postpartum booklet. ?Activity: Advance as tolerated. Pelvic rest for 6 weeks.  ?Diet: routine diet ?Future Appointments: ?Future Appointments  ?Date Time Provider [place]  ?06/05/2021  2:00 PM [facility] NURSE [facility] [place]  ?07/06/2021  8:20 AM [facility] LAB [facility] [place]  ?07/06/2021  9:15 AM [name], MD [facility]  ? ?Follow up Visit: ?Message sent to [facility] by Dr. Gwenlyn Perking on 05/27/21.  ? ?Please schedule this patient for a In person postpartum  visit in 6 weeks with the following provider: Any provider. ?Additional Postpartum F/U: 2 hour GTT and Incision check 1 week  ?High  risk pregnancy complicated by: Hx of CS x2, GDM, fibroid uterus, AMA ?Delivery mode:  C-Section, Low Transverse  ?Anticipated Birth Control:  Depo at postpartum visit  ? ?05/30/2021 ?Genia Del, MD ? ? ? ?

## 2021-05-27 NOTE — Lactation Note (Addendum)
This note was copied from a baby's chart. ?Lactation Consultation Note ? ?Patient Name: Carol Walls ?Today's Date: 05/27/2021 ?  ?Age:43 hours ? ?LC alerted RN, Freida Busman to set up DEBP as infant LPTI. Infant first glucose was normal.  ?LC returned to work with Mom but RN, Eartha Inch working infant at the time. ?LC to return once infant vitals stabilize.  ? ?Infant placed in nursery for temp regulation. ? ?Maternal Data ?  ? ?Feeding ?  ? ?LATCH Score ?Latch: Repeated attempts needed to sustain latch, nipple held in mouth throughout feeding, stimulation needed to elicit sucking reflex. ? ?Audible Swallowing: A few with stimulation ? ?Type of Nipple: Everted at rest and after stimulation ? ?Comfort (Breast/Nipple): Soft / non-tender ? ?Hold (Positioning): Assistance needed to correctly position infant at breast and maintain latch. ? ?LATCH Score: 7 ? ? ?Lactation Tools Discussed/Used ?  ? ?Interventions ?  ? ?Discharge ?  ? ?Consult Status ?  ? ? ? ?[name]  Nicholson-Springer ?05/27/2021, 8:28 PM ? ? ? ?

## 2021-05-27 NOTE — Transfer of Care (Signed)
Immediate Anesthesia Transfer of Care Note ? ?Patient: Carol Walls ? ?Procedure(s) Performed: CESAREAN SECTION ? ?Patient Location: PACU ? ?Anesthesia Type:Spinal ? ?Level of Consciousness: awake, alert , oriented and patient cooperative ? ?Airway & Oxygen Therapy: Patient Spontanous Breathing ? ?Post-op Assessment: Report given to RN and Post -op Vital signs reviewed and stable ? ?Post vital signs: Reviewed and stable ? ?Last Vitals:  ?Vitals Value Taken Time  ?BP    ?Temp    ?Pulse    ?Resp    ?SpO2    ? ? ?Last Pain:  ?Vitals:  ? 05/27/21 1132  ?TempSrc: Oral  ?   ? ?  ? ?Complications: No notable events documented. ?

## 2021-05-28 LAB — CBC
HCT: 25.6 % — ABNORMAL LOW (ref 36.0–46.0)
Hemoglobin: 9.1 g/dL — ABNORMAL LOW (ref 12.0–15.0)
MCH: 30.6 pg (ref 26.0–34.0)
MCHC: 35.5 g/dL (ref 30.0–36.0)
MCV: 86.2 fL (ref 80.0–100.0)
Platelets: 122 10*3/uL — ABNORMAL LOW (ref 150–400)
RBC: 2.97 MIL/uL — ABNORMAL LOW (ref 3.87–5.11)
RDW: 13.2 % (ref 11.5–15.5)
WBC: 5.4 10*3/uL (ref 4.0–10.5)
nRBC: 0 % (ref 0.0–0.2)

## 2021-05-28 LAB — RPR: RPR Ser Ql: NONREACTIVE

## 2021-05-28 MED ORDER — SODIUM CHLORIDE 0.9 % IV SOLN
500.0000 mg | Freq: Once | INTRAVENOUS | Status: AC
  Administered 2021-05-28: 500 mg via INTRAVENOUS
  Filled 2021-05-28: qty 25

## 2021-05-28 NOTE — Lactation Note (Signed)
This note was copied from a baby's chart. ?Lactation Consultation Note ? ?Patient Name: Carol Walls ?Today's Date: 05/28/2021 ?Reason for consult: Follow-up assessment;Mother's request;Late-preterm 34-36.6wks;Breastfeeding assistance;Maternal endocrine disorder ?Age:43 hours ? ?On arrival, Mom sleeping with infant on the bed. LC reviewed with Mom safety precautions of placing infant in bassinet when she is tired on his back. LC removed infant from bed, swaddled him and placed in bassinet near to Mom's bed on his back, locking wheels in place.  ?RN, Eartha Inch alerted of findings above.  ? ?Mom states infant fed formula [facility] hr [facility]. LC talked with Mom about pumping with dEBP, she declined.  ? ?Mom states feeding plan to breast feed and offer formula.  ? ?LPTI guidelines reviewed to reduce calorie loss.  ?Plan 1. To feed based on cues 8-12x 24hr period. Mom to offer breasts and look for signs of milk transfer.  ?2. Mom to supplement with formula. Mom aware if infant not latching at breast for feeding to offer more. BF supplementation guide provided.  ? ?All questions answered at the end of the visit.  ? ?Maternal Data ?  ? ?Feeding ?Mother's Current Feeding Choice: Breast Milk and Formula ? ?LATCH Score ?  ? ?  ? ?  ? ?  ? ?  ? ?  ? ? ?Lactation Tools Discussed/Used ?  ? ?Interventions ?Interventions: Breast feeding basics reviewed;Skin to skin;Expressed milk;Education;Pace feeding;Infant Driven Feeding Algorithm education;LPT handout/interventions ? ?Discharge ?  ? ?Consult Status ?Consult Status: Follow-up ?Date: 05/29/21 ?Follow-up type: In-patient ? ? ? ?Miranda Garber  Nicholson-Springer ?05/28/2021, 10:21 PM ? ? ? ?

## 2021-05-28 NOTE — Lactation Note (Addendum)
This note was copied from a baby's chart. ?Lactation Consultation Note ? ?Patient Name: Carol Walls ?Today's Date: 05/28/2021 ?Reason for consult: Initial assessment;Late-preterm 34-36.6wks ?Age:43 hours ? ? ?P7 mother whose infant is now 8 hours old.  Mother's current feeding preference is breast/formula.  Mother had GDM; blood sugars WNL. ? ?Mother declined interpreter services. ? ?Baby was swaddled and asleep in the bassinet when I arrived.  Since it has been 3 1/2 hours since his last feeding, I awakened mother.  She was willing to attempt breast feeding.  Provided a few colostrum drops. Discussed STS and assisted to latch.  Once latched, baby was not at all interested.  Provided formula and reviewed supplementation volumes.  Observed mother feeding 12 mls easily with the white Nfant nipple.  Baby burped and placed in bassinet. ? ?Due to mother's sleepiness I did not review the LPTI guidelines at this time.  Continue to educate.  No support person present.  RN updated. ? ? ?Maternal Data ?Has patient been taught Hand Expression?: Yes ?Does the patient have breastfeeding experience prior to this delivery?: Yes ?How long did the patient breastfeed?: 1-1/2 - 2 years with her other children ? ?Feeding ?Mother's Current Feeding Choice: Breast Milk and Formula ?Nipple Type: Nfant Standard Flow (white) ? ?LATCH Score ?Latch: Too sleepy or reluctant, no latch achieved, no sucking elicited. ? ?Audible Swallowing: None ? ?Type of Nipple: Everted at rest and after stimulation ? ?Comfort (Breast/Nipple): Soft / non-tender ? ?Hold (Positioning): Assistance needed to correctly position infant at breast and maintain latch. ? ?LATCH Score: 5 ? ? ?Lactation Tools Discussed/Used ?  ? ?Interventions ?Interventions: Breast feeding basics reviewed;Assisted with latch;Skin to skin;Hand express;Position options;Support pillows;Adjust position;Education ? ?Discharge ?  ? ?Consult Status ?Consult Status: Follow-up ?Date:  05/29/21 ?Follow-up type: In-patient ? ? ? ?Janos Shampine R Iliza Blankenbeckler ?05/28/2021, 4:26 AM ? ? ? ?

## 2021-05-28 NOTE — Progress Notes (Signed)
POSTPARTUM PROGRESS NOTE ? ?POD #1 ? ?Subjective: ? ?Carol Walls is a 41 y.o. [id number] s/p rLTCS at [redacted]w[redacted]d No acute events overnight. She reports she is doing well. She denies any problems with ambulating, voiding or po intake. Denies nausea or vomiting. She has passed flatus. Pain is well controlled.  Lochia is mild. ? ?Objective: ?Blood pressure 102/68, pulse 78, temperature 97.8 ?F (36.6 ?C), temperature source Oral, resp. rate 17, last menstrual period 09/17/2020, SpO2 97 %, unknown if currently breastfeeding. ? ?Physical Exam:  ?General: alert, cooperative and no distress ?Chest: no respiratory distress ?Heart: regular rate, distal pulses intact ?Uterine Fundus: firm, appropriately tender ?DVT Evaluation: No calf swelling or tenderness ?Extremities: minimal edema ?Skin: warm, dry; incision clean/dry/intact w/ honeycomb dressing in place ? ?Recent Labs  ?  05/27/21 ?0811 05/28/21 ?0512  ?HGB 10.4* 9.1*  ?HCT 30.6* 25.6*  ? ? ?Assessment/Plan: ?Carol QUIMBYis a 41y.o. [id number]/p rLTCS at 325w0d? ?POD#1 - Doing welll; pain well controlled.  ? Routine postpartum care ? OOB, ambulated ? Lovenox for VTE prophylaxis ? ?Acute blood loss Anemia: asymptomatic ? Ordered IV venofer.  ? ?Gestational thrombocytopenia: Stable. Plts 122 from 127.  ? ?Contraception: unsure  ?Feeding: breast ? ?Desires circ for infant. Consented mom for circ, note placed into his chart.  ? ?Dispo: Plan for discharge tomorrow or the following day. ? ? LOS: 1 day  ? ?SaDarrelyn HillockDO ?OB Fellow  ?05/28/2021, 8:46 AM  ?

## 2021-05-29 NOTE — Lactation Note (Signed)
This note was copied from a baby's chart. ?Lactation Consultation Note ? ?Patient Name: Carol Walls ?Today's Date: 05/29/2021 ?Reason for consult: Follow-up assessment;Late-preterm 34-36.6wks;Other (Comment) (per mom the baby recently fed from the breast and bottle fed 58 ml. LC reviewed and updated the doc flow sheets per mom.) ?Age:43 hours ?Previous LC - in her LC noted mentioned mom declined pumping.  ?Mom is following previous feeding plan.  ? ?Maternal Data ?  ? ?Feeding ?Mother's Current Feeding Choice: Breast Milk and Formula ?Nipple Type: Nfant Slow Flow (purple) ? ?LATCH Score ?  ? ?  ? ?  ? ?  ? ?  ? ?  ? ? ?Lactation Tools Discussed/Used ?  ? ?Interventions ?  ? ?Discharge ?  ? ?Consult Status ?Consult Status: Follow-up ?Date: 05/30/21 ?Follow-up type: In-patient ? ? ? ?Jerlyn Ly Janila Arrazola ?05/29/2021, 6:56 PM ? ? ? ?

## 2021-05-29 NOTE — Progress Notes (Signed)
CSW acknowledged consult and met with MOB and FOB in room 402 to assess for needs.  When CSW arrived, infant was in the nursery, FOB was sitting on the couch, and MOB was resting in bed.  CSW assessed for a need for a car seat.  The couple confirmed that they need a car seat. CSW shared with the couple that the cost for the hospital's car seat is $30. Per the couple thy were informed that the car seat will be free.  After CSW confirmed that the car seat is not free, FOB communicated that the couple has a car seat at home that he will bring prior to infant's discharge.  FOB confimred that the car seat at there home is not expired.  CSW updated bedside RN. ? ?There are no barriers to discharge.  ? ?Carol Walls, MSW, LCSW ?Clinical Social Work ?[phone] ?

## 2021-05-29 NOTE — Progress Notes (Signed)
POSTPARTUM PROGRESS NOTE ? ?Subjective: Carol Walls is a 41 y.o. Q6V7846 POD#2 s/p rLTCS at [redacted]w[redacted]d  She reports she doing well. No acute events overnight. She denies any problems with ambulating, voiding or po intake. Denies nausea or vomiting. She has passed flatus. Pain is well controlled.  Lochia is scant. ? ?Objective: ?Blood pressure 116/72, pulse (!) 101, temperature 98.4 ?F (36.9 ?C), temperature source Oral, resp. rate 20, last menstrual period 09/17/2020, SpO2 100 %, unknown if currently breastfeeding. ? ?Physical Exam:  ?General: alert, cooperative and no distress ?Chest: no respiratory distress ?Abdomen: soft, non-tender  incision clean dry intact with dressing in place  ?Uterine Fundus: firm, appropriately tender ?Extremities: No calf swelling or tenderness  no edema ? ?Recent Labs  ?  05/27/21 ?0811 05/28/21 ?0512  ?HGB 10.4* 9.1*  ?HCT 30.6* 25.6*  ? ? ?Assessment/Plan: ?HMAME TWOMBLYis a 41y.o. [id number]#2 s/p rLTCS ? ?POD#2: Doing well, pain well-controlled. H/H appropriate.  ?-- Routine postpartum care ?-- Encouraged up OOB ?-- Lovenox for VTE prophylaxis ?- patient would like to stay till POD#3 as baby being monitored. We discussed option of discharging today and patient rooming in with baby or staying as patient. Patient would like to stay as patient today. ? ?Routine Postpartum Care ?-- Contraception: considering IUD (outpatient) ?-- Feeding: breast and bottle ? ?Dispo: Plan for discharge POD#3. ? ?ARenard Matter MD, MPH ?OB Fellow, Faculty Practice ?[facility] ? ? ? ?

## 2021-05-30 LAB — SURGICAL PATHOLOGY

## 2021-05-30 MED ORDER — IBUPROFEN 600 MG PO TABS: 600.0000 mg | ORAL_TABLET | Freq: Four times a day (QID) | ORAL | 0 refills | Status: DC | PRN

## 2021-05-30 MED ORDER — OXYCODONE HCL 5 MG PO TABS: 5.0000 mg | ORAL_TABLET | Freq: Four times a day (QID) | ORAL | 0 refills | Status: DC | PRN

## 2021-05-30 MED ORDER — ACETAMINOPHEN 500 MG PO TABS: 1000.0000 mg | ORAL_TABLET | Freq: Three times a day (TID) | ORAL | 0 refills | Status: DC | PRN

## 2021-05-31 ENCOUNTER — Other Ambulatory Visit: Payer: Medicaid Other

## 2021-05-31 LAB — BPAM RBC
Blood Product Expiration Date: 202305092359
Blood Product Expiration Date: 202305102359
Unit Type and Rh: 7300
Unit Type and Rh: 7300

## 2021-05-31 LAB — TYPE AND SCREEN
ABO/RH(D): B POS
Antibody Screen: NEGATIVE
Unit division: 0
Unit division: 0

## 2021-06-05 ENCOUNTER — Ambulatory Visit: Payer: Medicaid Other

## 2021-06-06 ENCOUNTER — Telehealth (HOSPITAL_COMMUNITY): Payer: Self-pay | Admitting: *Deleted

## 2021-06-06 NOTE — Telephone Encounter (Signed)
Interpreter left phone voicemail message. ? ?Odis Hollingshead, RN 06-06-2021 at 12:44pm ?

## 2021-06-07 ENCOUNTER — Encounter: Payer: Medicaid Other | Admitting: Obstetrics and Gynecology

## 2021-06-07 ENCOUNTER — Other Ambulatory Visit: Payer: Medicaid Other

## 2021-06-08 ENCOUNTER — Ambulatory Visit (INDEPENDENT_AMBULATORY_CARE_PROVIDER_SITE_OTHER): Payer: Medicaid Other

## 2021-06-08 VITALS — BP 112/85 | HR 86 | Wt 189.2 lb

## 2021-06-08 DIAGNOSIS — Z98891 History of uterine scar from previous surgery: Secondary | ICD-10-CM

## 2021-06-08 NOTE — Progress Notes (Signed)
Pt here today for wound check. Pt had repeat C-section on 05/27/2021.  ?Pt denies any drainage, redness or fever to site.  ? ?Incision today is clean, dry, intact and well approximated. Pt had honeycomb dressing and Steri-strips that were removed two days ago at home. Pt tolerated well.  ? ?Pt advised to keep clean and dry. Pt verbalized understanding.  ? ?Pt has scheduled PP visit on 07/06/2021.  ? ?Colletta Maryland, RN   ?

## 2021-06-14 ENCOUNTER — Other Ambulatory Visit: Payer: Medicaid Other

## 2021-06-15 ENCOUNTER — Encounter (HOSPITAL_COMMUNITY): Admission: RE | Admit: 2021-06-15 | Payer: Medicaid Other | Source: Ambulatory Visit

## 2021-06-17 ENCOUNTER — Inpatient Hospital Stay (HOSPITAL_COMMUNITY)
Admission: RE | Admit: 2021-06-17 | Payer: Medicaid Other | Source: Home / Self Care | Admitting: Obstetrics & Gynecology

## 2021-06-17 ENCOUNTER — Encounter (HOSPITAL_COMMUNITY): Admission: RE | Payer: Self-pay | Source: Home / Self Care

## 2021-06-17 SURGERY — Surgical Case
Anesthesia: Regional

## 2021-07-06 ENCOUNTER — Encounter: Payer: Self-pay | Admitting: Family Medicine

## 2021-07-06 ENCOUNTER — Other Ambulatory Visit: Payer: Self-pay

## 2021-07-06 ENCOUNTER — Other Ambulatory Visit: Payer: Medicaid Other

## 2021-07-06 ENCOUNTER — Ambulatory Visit (INDEPENDENT_AMBULATORY_CARE_PROVIDER_SITE_OTHER): Payer: Medicaid Other | Admitting: Family Medicine

## 2021-07-06 VITALS — BP 116/80 | HR 75 | Wt 184.2 lb

## 2021-07-06 DIAGNOSIS — Z30013 Encounter for initial prescription of injectable contraceptive: Secondary | ICD-10-CM

## 2021-07-06 DIAGNOSIS — D509 Iron deficiency anemia, unspecified: Secondary | ICD-10-CM

## 2021-07-06 DIAGNOSIS — O0990 Supervision of high risk pregnancy, unspecified, unspecified trimester: Secondary | ICD-10-CM

## 2021-07-06 DIAGNOSIS — O99019 Anemia complicating pregnancy, unspecified trimester: Secondary | ICD-10-CM | POA: Diagnosis not present

## 2021-07-06 DIAGNOSIS — Z8632 Personal history of gestational diabetes: Secondary | ICD-10-CM | POA: Diagnosis not present

## 2021-07-06 DIAGNOSIS — Z3202 Encounter for pregnancy test, result negative: Secondary | ICD-10-CM | POA: Diagnosis not present

## 2021-07-06 DIAGNOSIS — O24429 Gestational diabetes mellitus in childbirth, unspecified control: Secondary | ICD-10-CM

## 2021-07-06 DIAGNOSIS — O24419 Gestational diabetes mellitus in pregnancy, unspecified control: Secondary | ICD-10-CM

## 2021-07-06 LAB — POCT PREGNANCY, URINE: Preg Test, Ur: NEGATIVE

## 2021-07-06 MED ORDER — PREPLUS 27-1 MG PO TABS: 1.0000 | ORAL_TABLET | Freq: Every day | ORAL | 3 refills | Status: DC

## 2021-07-06 MED ORDER — FERROUS SULFATE 325 (65 FE) MG PO TABS: 325.0000 mg | ORAL_TABLET | ORAL | 1 refills | Status: DC

## 2021-07-06 MED ORDER — MEDROXYPROGESTERONE ACETATE 150 MG/ML IM SUSP
150.0000 mg | Freq: Once | INTRAMUSCULAR | Status: AC
  Administered 2021-07-06: 150 mg via INTRAMUSCULAR

## 2021-07-06 NOTE — Progress Notes (Signed)
[place] Partum Visit Note  Carol Walls is a 41 y.o. Q1J9417 female who presents for a postpartum visit. She is 5 weeks postpartum following a repeat cesarean section.  I have fully reviewed the prenatal and intrapartum course. The delivery was at 64w0dgestational weeks.  Anesthesia: spinal. Postpartum course has been . Baby is doing well. Baby is feeding by both breast and bottle - Gerber . Bleeding: reports postpartum bleeding stopped and menstrual period began yesterday. Bowel function is normal. Bladder function is normal. Patient is not sexually active. Contraception method is Depo-Provera injections. Postpartum depression screening: negative.   The pregnancy intention screening data noted above was reviewed. Potential methods of contraception were discussed. The patient elected to proceed with No data recorded.   Edinburgh Postnatal Depression Scale - 07/06/21 0836       Edinburgh Postnatal Depression Scale:  In the Past 7 Days   I have been able to laugh and see the funny side of things. 0    I have looked forward with enjoyment to things. 0    I have blamed myself unnecessarily when things went wrong. 0    I have been anxious or worried for no good reason. 0    I have felt scared or panicky for no good reason. 0    Things have been getting on top of me. 0    I have been so unhappy that I have had difficulty sleeping. 0    I have felt sad or miserable. 0    I have been so unhappy that I have been crying. 0    The thought of harming myself has occurred to me. 0    Edinburgh Postnatal Depression Scale Total 0             Health Maintenance Due  Topic Date Due   URINE MICROALBUMIN  Never done   MAMMOGRAM  Never done    The following portions of the patient's history were reviewed and updated as appropriate: allergies, current medications, past family history, past medical history, past social history, past surgical history, and problem list.  Review of  Systems Pertinent items noted in HPI and remainder of comprehensive ROS otherwise negative.  Objective:  BP 116/80   Pulse 75   Wt 184 lb 3.2 oz (83.6 kg)   LMP 07/05/2021   Breastfeeding Yes   BMI 29.73 kg/m    General:  alert, cooperative, and appears stated age  Lungs: Normal effort  Heart:  regular rate and rhythm  Abdomen: soft, non-tender; bowel sounds normal; no masses,  no organomegaly   Wound well approximated incision       Assessment:   Normal postpartum exam.   Plan:   Essential components of care per ACOG recommendations:  1.  Mood and well being: Patient with negative depression screening today. Reviewed local resources for support.  - Patient tobacco use? No.   - hx of drug use? No.    2. Infant care and feeding:  -Patient currently breastmilk feeding? Yes. Reviewed importance of draining breast regularly to support lactation.  -Social determinants of health (SDOH) reviewed in EPIC. No concerns  3. Sexuality, contraception and birth spacing - Patient does not want a pregnancy in the next year.  Desired family size is 6 children.  - Reviewed reproductive life planning. Reviewed contraceptive methods based on pt preferences and effectiveness.  Patient desired Abstinence today.   - Discussed birth spacing of 18 months  4. Sleep and fatigue -  Encouraged family/partner/community support of 4 hrs of uninterrupted sleep to help with mood and fatigue  5. Physical Recovery  - Discussed patients delivery and complications. She describes her labor as good. - Patient had a C-section.  - Patient has urinary incontinence? No. - Patient is safe to resume physical and sexual activity  6.  Health Maintenance - HM due items addressed Yes - Last pap smear  Diagnosis  Date Value Ref Range Status  05/04/2019   Final   - Negative for intraepithelial lesion or malignancy (NILM)   Pap smear done at today's visit.  -Breast Cancer screening indicated? Yes, but nursing-will  refer for mammogram with next visit, to include pap due 04/2022  7. Chronic Disease/Pregnancy Condition follow up: Gestational Diabetes  - PCP follow up  [name], [place] [facility] [place]

## 2021-07-07 LAB — GLUCOSE TOLERANCE, 2 HOURS
Glucose, 2 hour: 94 mg/dL (ref 70–139)
Glucose, GTT - Fasting: 89 mg/dL (ref 70–99)

## 2021-09-21 ENCOUNTER — Ambulatory Visit (INDEPENDENT_AMBULATORY_CARE_PROVIDER_SITE_OTHER): Payer: Medicaid Other

## 2021-09-21 ENCOUNTER — Other Ambulatory Visit: Payer: Self-pay

## 2021-09-21 VITALS — BP 126/77 | HR 96

## 2021-09-21 DIAGNOSIS — Z3042 Encounter for surveillance of injectable contraceptive: Secondary | ICD-10-CM

## 2021-09-21 MED ORDER — MEDROXYPROGESTERONE ACETATE 150 MG/ML IM SUSP
150.0000 mg | Freq: Once | INTRAMUSCULAR | Status: AC
  Administered 2021-09-21: 150 mg via INTRAMUSCULAR

## 2021-09-21 NOTE — Progress Notes (Signed)
Carol Walls here for Depo-Provera Injection. Injection administered without complication. Patient will return in 3 months for next injection between 12/07/21 and 12/21/21. Next annual visit due May 2024.   Patient declined interpreter for this encounter.  Annabell Howells, RN 09/21/2021  3:41 PM

## 2021-12-07 ENCOUNTER — Ambulatory Visit (INDEPENDENT_AMBULATORY_CARE_PROVIDER_SITE_OTHER): Payer: Medicaid Other

## 2021-12-07 ENCOUNTER — Other Ambulatory Visit: Payer: Self-pay

## 2021-12-07 DIAGNOSIS — Z3042 Encounter for surveillance of injectable contraceptive: Secondary | ICD-10-CM

## 2021-12-07 MED ORDER — MEDROXYPROGESTERONE ACETATE 150 MG/ML IM SUSP
150.0000 mg | Freq: Once | INTRAMUSCULAR | Status: AC
  Administered 2021-12-07: 150 mg via INTRAMUSCULAR

## 2021-12-07 NOTE — Progress Notes (Signed)
Carol Walls here for Depo-Provera Injection. Injection administered without complication. Patient will return in 3 months for next injection between 01/11 and 1/25. Next annual visit due May 2024.   Darlyne Russian, RN 12/07/2021  2:39 PM

## 2022-02-22 ENCOUNTER — Ambulatory Visit: Payer: Medicaid Other

## 2022-03-02 ENCOUNTER — Ambulatory Visit (INDEPENDENT_AMBULATORY_CARE_PROVIDER_SITE_OTHER): Payer: Medicaid Other

## 2022-03-02 DIAGNOSIS — Z3042 Encounter for surveillance of injectable contraceptive: Secondary | ICD-10-CM | POA: Diagnosis not present

## 2022-03-02 MED ORDER — MEDROXYPROGESTERONE ACETATE 150 MG/ML IM SUSP
150.0000 mg | Freq: Once | INTRAMUSCULAR | Status: AC
  Administered 2022-03-02: 150 mg via INTRAMUSCULAR

## 2022-03-02 NOTE — Progress Notes (Signed)
Woodroe Chen here for Depo-Provera Injection. Injection administered without complication. Patient will return in 3 months for next injection between 04/04 and 04/18. Next annual visit due May 2024.   Martina Sinner, RN 03/02/2022  10:38 AM

## 2022-05-17 ENCOUNTER — Ambulatory Visit: Payer: BLUE CROSS/BLUE SHIELD

## 2022-07-02 ENCOUNTER — Ambulatory Visit (INDEPENDENT_AMBULATORY_CARE_PROVIDER_SITE_OTHER): Payer: BLUE CROSS/BLUE SHIELD | Admitting: General Practice

## 2022-07-02 ENCOUNTER — Other Ambulatory Visit: Payer: Self-pay

## 2022-07-02 VITALS — BP 103/76 | HR 88 | Ht 66.0 in | Wt 172.0 lb

## 2022-07-02 DIAGNOSIS — Z3042 Encounter for surveillance of injectable contraceptive: Secondary | ICD-10-CM | POA: Diagnosis not present

## 2022-07-02 LAB — POCT PREGNANCY, URINE: Preg Test, Ur: NEGATIVE

## 2022-07-02 MED ORDER — MEDROXYPROGESTERONE ACETATE 150 MG/ML IM SUSY
150.0000 mg | PREFILLED_SYRINGE | Freq: Once | INTRAMUSCULAR | Status: AC
  Administered 2022-07-02: 150 mg via INTRAMUSCULAR

## 2022-07-02 MED ORDER — PREPLUS 27-1 MG PO TABS: 1.0000 | ORAL_TABLET | Freq: Every day | ORAL | 0 refills | Status: DC

## 2022-07-02 NOTE — Progress Notes (Signed)
Rolene Course here for Depo-Provera Injection. Injection administered without complication. Patient will return in 3 months for next injection between Aug 5 and Aug 19. Next annual visit due next visit.   Marylynn Pearson, RN 07/02/2022  3:00 PM

## 2022-08-27 ENCOUNTER — Telehealth (INDEPENDENT_AMBULATORY_CARE_PROVIDER_SITE_OTHER): Payer: Self-pay | Admitting: Primary Care

## 2022-08-27 NOTE — Telephone Encounter (Signed)
Spoke to pt. Will be at apt.  

## 2022-08-28 ENCOUNTER — Encounter (INDEPENDENT_AMBULATORY_CARE_PROVIDER_SITE_OTHER): Payer: Self-pay | Admitting: Primary Care

## 2022-08-28 ENCOUNTER — Ambulatory Visit (INDEPENDENT_AMBULATORY_CARE_PROVIDER_SITE_OTHER): Payer: BLUE CROSS/BLUE SHIELD | Admitting: Primary Care

## 2022-08-28 VITALS — BP 116/74 | HR 80 | Resp 16 | Ht 66.0 in | Wt 178.2 lb

## 2022-08-28 DIAGNOSIS — N920 Excessive and frequent menstruation with regular cycle: Secondary | ICD-10-CM

## 2022-08-28 DIAGNOSIS — E663 Overweight: Secondary | ICD-10-CM | POA: Diagnosis not present

## 2022-08-28 DIAGNOSIS — Z8632 Personal history of gestational diabetes: Secondary | ICD-10-CM

## 2022-08-28 DIAGNOSIS — H6123 Impacted cerumen, bilateral: Secondary | ICD-10-CM

## 2022-08-28 DIAGNOSIS — Z6828 Body mass index (BMI) 28.0-28.9, adult: Secondary | ICD-10-CM

## 2022-08-28 DIAGNOSIS — Z7689 Persons encountering health services in other specified circumstances: Secondary | ICD-10-CM

## 2022-08-28 NOTE — Progress Notes (Signed)
New Patient Office Visit  Subjective    Patient ID: Carol Walls, female    DOB: [birth year]  Age: 42 y.o. MRN: [id number]  CC: cerumen in ear   HPI [name] presents to establish care. She voices only concern that she has cerumen in her ear and removed. Patient has No headache, No chest pain, No abdominal pain - No Nausea, No new weakness tingling or numbness, No Cough - shortness of breath    Outpatient Encounter Medications as of 08/28/2022  Medication Sig   Prenatal Vit-Fe Fumarate-FA (PREPLUS) 27-1 MG TABS Take 1 tablet by mouth daily. (Patient not taking: Reported on 07/02/2022)   No facility-administered encounter medications on file as of 08/28/2022.    Past Medical History:  Diagnosis Date   Back pain    Fibroids    GDM (gestational diabetes mellitus) 02/2021    Past Surgical History:  Procedure Laterality Date   CESAREAN SECTION     CESAREAN SECTION  12/06/2011   Procedure: CESAREAN SECTION;  Surgeon: Kathreen Cosier, MD;  Location: [facility];  Service: Obstetrics;  Laterality: N/A;  Repeat Cesarean Section with birth of baby A Boy @ 2125, 80 B Girl  @ 2127   CESAREAN SECTION N/A 05/27/2021   Procedure: CESAREAN SECTION;  Surgeon: Myna Hidalgo, DO;  Location: [facility];  Service: Obstetrics;  Laterality: N/A;    Family History  Problem Relation Age of Onset   Heart attack Brother 64    Social History   Socioeconomic History   Marital status: Married    Spouse name: Not on file   Number of children: Not on file   Years of education: Not on file   Highest education level: Not on file  Occupational History   Not on file  Tobacco Use   Smoking status: Never   Smokeless tobacco: Never  Vaping Use   Vaping status: Never Used  Substance and Sexual Activity   Alcohol use: Never   Drug use: Never   Sexual activity: Yes    Birth control/protection: None  Other Topics Concern   Not on file  Social History Narrative   Not on file   Social  Determinants of Health   Financial Resource Strain: Not on file  Food Insecurity: No Food Insecurity (01/27/2021)   Hunger Vital Sign    Worried About Running Out of Food in the Last Year: Never true    Ran Out of Food in the Last Year: Never true  Transportation Needs: No Transportation Needs (01/27/2021)   PRAPARE - Administrator, Civil Service (Medical): No    Lack of Transportation (Non-Medical): No  Physical Activity: Not on file  Stress: Not on file  Social Connections: Not on file  Intimate Partner Violence: Not on file    ROS Comprehensive ROS Pertinent positive and negative noted in HPI       Objective   Blood Pressure 116/74   Pulse 80   Respiration 16   Height 5\' 6"  (1.676 m)   Weight 178 lb 3.2 oz (80.8 kg)   Oxygen Saturation 98%   Body Mass Index 28.76 kg/m    Physical Exam Vitals reviewed.  HENT:     Head: Normocephalic.     Right Ear: External ear normal. There is impacted cerumen.     Left Ear: External ear normal. There is impacted cerumen.     Nose: Nose normal.  Eyes:     Extraocular Movements: Extraocular movements  intact.     Pupils: Pupils are equal, round, and reactive to light.  Cardiovascular:     Rate and Rhythm: Normal rate and regular rhythm.  Pulmonary:     Effort: Pulmonary effort is normal.     Breath sounds: Normal breath sounds.  Abdominal:     General: Abdomen is flat. Bowel sounds are normal.     Palpations: Abdomen is soft.  Musculoskeletal:        General: Normal range of motion.     Cervical back: Normal range of motion.  Skin:    General: Skin is warm and dry.  Neurological:     Mental Status: She is alert and oriented to person, place, and time.  Psychiatric:        Mood and Affect: Mood normal.        Behavior: Behavior normal.       Assessment & Plan:  [name] was seen today for new patient (initial visit) and cerumen impaction.  Diagnoses and all orders for this visit:  Encounter to establish  care  History of gestational diabetes -     Hemoglobin A1c -     Lipid Panel  Over weight Discussed diet and exercise for person with BMI >25. Instructed: You must burn more calories than you eat. Losing 5 percent of your body weight should be considered a success. In the longer term, losing more than 15 percent of your body weight and staying at this weight is an extremely good result. However, keep in mind that even losing 5 percent of your body weight leads to important health benefits, so try not to get discouraged if you're not able to lose more than this. Will recheck weight in 3-6 months.   Menorrhagia with regular cycle -     CBC with Differential -     CMP14+EGFR  Bilateral impacted cerumen  Ear irragation   Grayce Sessions, NP

## 2022-08-29 ENCOUNTER — Other Ambulatory Visit (INDEPENDENT_AMBULATORY_CARE_PROVIDER_SITE_OTHER): Payer: Self-pay | Admitting: Primary Care

## 2022-08-29 DIAGNOSIS — E782 Mixed hyperlipidemia: Secondary | ICD-10-CM

## 2022-08-29 LAB — CBC WITH DIFFERENTIAL/PLATELET
Basophils Absolute: 0 10*3/uL (ref 0.0–0.2)
Basos: 1 %
EOS (ABSOLUTE): 0.1 10*3/uL (ref 0.0–0.4)
Eos: 2 %
Hematocrit: 40.5 % (ref 34.0–46.6)
Hemoglobin: 13.6 g/dL (ref 11.1–15.9)
Immature Grans (Abs): 0 10*3/uL (ref 0.0–0.1)
Immature Granulocytes: 0 %
Lymphocytes Absolute: 1.7 10*3/uL (ref 0.7–3.1)
Lymphs: 46 %
MCH: 29.4 pg (ref 26.6–33.0)
MCHC: 33.6 g/dL (ref 31.5–35.7)
MCV: 88 fL (ref 79–97)
Monocytes Absolute: 0.4 10*3/uL (ref 0.1–0.9)
Monocytes: 10 %
Neutrophils Absolute: 1.6 10*3/uL (ref 1.4–7.0)
Neutrophils: 41 %
Platelets: 141 10*3/uL — ABNORMAL LOW (ref 150–450)
RBC: 4.62 x10E6/uL (ref 3.77–5.28)
RDW: 12.4 % (ref 11.7–15.4)
WBC: 3.7 10*3/uL (ref 3.4–10.8)

## 2022-08-29 LAB — CMP14+EGFR
ALT: 19 IU/L (ref 0–32)
AST: 18 IU/L (ref 0–40)
Albumin: 4.5 g/dL (ref 3.9–4.9)
Alkaline Phosphatase: 76 IU/L (ref 44–121)
BUN/Creatinine Ratio: 32 — ABNORMAL HIGH (ref 9–23)
BUN: 21 mg/dL (ref 6–24)
Bilirubin Total: <0.2 mg/dL (ref 0.0–1.2)
CO2: 22 mmol/L (ref 20–29)
Calcium: 9.5 mg/dL (ref 8.7–10.2)
Chloride: 103 mmol/L (ref 96–106)
Creatinine, Ser: 0.65 mg/dL (ref 0.57–1.00)
Globulin, Total: 2.8 g/dL (ref 1.5–4.5)
Glucose: 95 mg/dL (ref 70–99)
Potassium: 4.3 mmol/L (ref 3.5–5.2)
Sodium: 137 mmol/L (ref 134–144)
Total Protein: 7.3 g/dL (ref 6.0–8.5)
eGFR: 111 mL/min/{1.73_m2} (ref >59)

## 2022-08-29 LAB — LIPID PANEL
Chol/HDL Ratio: 3.1 ratio (ref 0.0–4.4)
Cholesterol, Total: 200 mg/dL — ABNORMAL HIGH (ref 100–199)
HDL: 64 mg/dL (ref >39)
LDL Chol Calc (NIH): 109 mg/dL — ABNORMAL HIGH (ref 0–99)
Triglycerides: 153 mg/dL — ABNORMAL HIGH (ref 0–149)
VLDL Cholesterol Cal: 27 mg/dL (ref 5–40)

## 2022-08-29 LAB — HEMOGLOBIN A1C
Est. average glucose Bld gHb Est-mCnc: 111 mg/dL
Hgb A1c MFr Bld: 5.5 % (ref 4.8–5.6)

## 2022-08-29 MED ORDER — ROSUVASTATIN CALCIUM 40 MG PO TABS: 40.0000 mg | ORAL_TABLET | Freq: Every day | ORAL | 3 refills | Status: AC

## 2022-09-03 ENCOUNTER — Ambulatory Visit (INDEPENDENT_AMBULATORY_CARE_PROVIDER_SITE_OTHER): Payer: BLUE CROSS/BLUE SHIELD

## 2022-09-19 ENCOUNTER — Telehealth: Payer: Self-pay | Admitting: Family Medicine

## 2022-09-19 NOTE — Telephone Encounter (Signed)
PT. Called in stating that she needs to change her ANNUAL APPOINTMENT because she has one with her son the same day. Informed patient we do not have any other openings for that day. She was insisting for [place] to give her the depo and schedule her annual for October. Informed patient we can not do that. She needs her annual exam before the depo. Patient will be call us back

## 2022-09-26 ENCOUNTER — Ambulatory Visit: Payer: BLUE CROSS/BLUE SHIELD | Admitting: Obstetrics and Gynecology

## 2022-10-24 ENCOUNTER — Other Ambulatory Visit: Payer: Self-pay

## 2022-10-24 ENCOUNTER — Other Ambulatory Visit (HOSPITAL_COMMUNITY)
Admission: RE | Admit: 2022-10-24 | Discharge: 2022-10-24 | Disposition: A | Discharge status: Home / Self Care | Payer: BLUE CROSS/BLUE SHIELD | Source: Ambulatory Visit | Attending: Obstetrics and Gynecology | Admitting: Obstetrics and Gynecology

## 2022-10-24 ENCOUNTER — Ambulatory Visit (INDEPENDENT_AMBULATORY_CARE_PROVIDER_SITE_OTHER): Payer: BLUE CROSS/BLUE SHIELD | Admitting: Obstetrics and Gynecology

## 2022-10-24 VITALS — BP 112/76 | HR 89 | Wt 180.4 lb

## 2022-10-24 DIAGNOSIS — Z3042 Encounter for surveillance of injectable contraceptive: Secondary | ICD-10-CM

## 2022-10-24 DIAGNOSIS — Z1330 Encounter for screening examination for mental health and behavioral disorders, unspecified: Secondary | ICD-10-CM | POA: Diagnosis not present

## 2022-10-24 DIAGNOSIS — Z01419 Encounter for gynecological examination (general) (routine) without abnormal findings: Secondary | ICD-10-CM

## 2022-10-24 DIAGNOSIS — Z124 Encounter for screening for malignant neoplasm of cervix: Secondary | ICD-10-CM | POA: Diagnosis present

## 2022-10-24 LAB — POCT PREGNANCY, URINE: Preg Test, Ur: NEGATIVE

## 2022-10-24 MED ORDER — MEDROXYPROGESTERONE ACETATE 150 MG/ML IM SUSY
150.0000 mg | PREFILLED_SYRINGE | Freq: Once | INTRAMUSCULAR | Status: AC
  Administered 2022-10-24: 150 mg via INTRAMUSCULAR

## 2022-10-24 NOTE — Progress Notes (Signed)
ANNUAL EXAM Patient name: Carol Walls MRN [id number]  Date of birth: March 22, 1980 Chief Complaint:   Gynecologic Exam  History of Present Illness:   Carol Walls is a 42 y.o. [id number] being seen today for a routine annual exam.  Current complaints: none  Menstrual concerns? No  no menses with depo  Breast or nipple changes? No scared to get mammogram completed - both the process and results Contraception use? Yes depo provera injection, last completed 07/02/2022 Sexually active? Yes no issues  No LMP recorded. Patient has had an injection.   Upstream - 10/24/22 1516       Pregnancy Intention Screening   Does the patient want to become pregnant in the next year? No    Does the patient's partner want to become pregnant in the next year? No    Would the patient like to discuss contraceptive options today? N/A   Pt here for Depo     Contraception Wrap Up   Current Method Hormonal Injection    Contraception Counseling Provided Yes            The pregnancy intention screening data noted above was reviewed. Potential methods of contraception were discussed. The patient elected to proceed with No data recorded.   Last pap     Component Value Date/Time   DIAGPAP  05/04/2019 1553    - Negative for intraepithelial lesion or malignancy (NILM)   HPVHIGH Negative 05/04/2019 1553   ADEQPAP  05/04/2019 1553    Satisfactory for evaluation; transformation zone component PRESENT.     H/O abnormal pap: no Last mammogram: none on file.  Last colonoscopy: n/a.      08/28/2022    2:05 PM 03/17/2021    1:58 PM 03/13/2021    8:41 AM 01/27/2021   10:58 AM 12/19/2020    2:15 PM  Depression screen PHQ 2/9  Decreased Interest 0 0 0 0 0  Down, Depressed, Hopeless 0 0 0 0 0  PHQ - 2 Score 0 0 0 0 0  Altered sleeping   0 0 0  Tired, decreased energy   0 0 0  Change in appetite   0 0 0  Feeling bad or failure about yourself    0 0 0  Trouble concentrating   0 0 0  Moving slowly or  fidgety/restless   0 0 0  Suicidal thoughts   0 0 0  PHQ-9 Score   0 0 0  Difficult doing work/chores   Not difficult at all Not difficult at all         08/28/2022    2:05 PM 03/13/2021    8:41 AM 01/27/2021   10:59 AM  GAD 7 : Generalized Anxiety Score  Nervous, Anxious, on Edge 0 0 0  Control/stop worrying 0 0 0  Worry too much - different things 0 0 0  Trouble relaxing 0 0 0  Restless 0 0 0  Easily annoyed or irritable 0 0 0  Afraid - awful might happen 0 0 0  Total GAD 7 Score 0 0 0  Anxiety Difficulty  Not difficult at all Not difficult at all     Review of Systems:   Pertinent items are noted in HPI Denies any headaches, blurred vision, fatigue, shortness of breath, chest pain, abdominal pain, abnormal vaginal discharge/itching/odor/irritation, problems with periods, bowel movements, urination, or intercourse unless otherwise stated above. Pertinent History Reviewed:  Reviewed past medical,surgical, social and family history.  Reviewed problem list, medications  and allergies. Physical Assessment:   Vitals:   10/24/22 1512  BP: 112/76  Pulse: 89  Weight: 180 lb 6.4 oz (81.8 kg)  Body mass index is 29.12 kg/m.        Physical Examination:   General appearance - well appearing, and in no distress  Mental status - alert, oriented to person, place, and time  Psych:  She has a normal mood and affect  Skin - warm and dry, normal color, no suspicious lesions noted  Chest - effort normal, all lung fields clear to auscultation bilaterally  Heart - normal rate and regular rhythm  Abdomen - soft, nontender, nondistended, no masses or organomegaly  Pelvic -  VULVA: normal labia majora, absent clitoral hood and clitoris    VAGINA: normal appearing vagina with normal color and discharge, no lesions   CERVIX: normal appearing cervix without discharge or lesions, no CMT  Thin prep pap is done with HR HPV cotesting  UTERUS: uterus is felt to be normal size, shape, consistency  and nontender   ADNEXA: No adnexal masses or tenderness noted.  Extremities:  No swelling or varicosities noted  Chaperone present for exam  No results found for this or any previous visit (from the past 24 hour(s)).    Assessment & Plan:  1. Well woman exam with routine gynecological exam - Cervical cancer screening: Discussed guidelines. Pap with HPV collected - STD Testing: declines - Birth Control: Discussed options and their risks, benefits and common side effects; discussed VTE with estrogen containing options. Desires: Depo - Breast Health: Encouraged self breast awareness/SBE. Teaching provided. Discussed limits of clinical breast exam for detecting breast cancer. Rx given for MXR - F/U 12 months and prn   2. Screening for cervical cancer Pap collected  Orders Placed This Encounter  Procedures   MM 3D SCREENING MAMMOGRAM BILATERAL BREAST   Pregnancy, urine POC    Meds: No orders of the defined types were placed in this encounter.   Follow-up: No follow-ups on file.  Lorriane Shire, MD 10/24/2022 3:35 PM

## 2022-10-26 LAB — CYTOLOGY - PAP
Adequacy: ABSENT
Comment: NEGATIVE
Diagnosis: NEGATIVE
High risk HPV: NEGATIVE

## 2022-10-31 ENCOUNTER — Telehealth: Payer: Self-pay

## 2022-10-31 NOTE — Telephone Encounter (Signed)
-----   Message from Lorriane Shire sent at 10/31/2022  2:16 PM EDT ----- Notify of normal pap and recommend repeat in 3-5 years. Please offer to schedule mammogram.

## 2022-10-31 NOTE — Telephone Encounter (Signed)
Attempted to call pt regarding normal pap smear results and to schedule an mammogram. VM left to call our office to schedule mammogram and results for her previous visit here. This call was use with the [employer] interpreter ID [phone].

## 2022-11-01 NOTE — Telephone Encounter (Signed)
LM on pt's voicemail with [employer].    Carol Walls  11/01/22

## 2022-11-08 NOTE — Telephone Encounter (Signed)
Called pt w/[employer] interpreter # [phone] and pt did not answer.  A message was left stating that we are calling with test result information and requested the pt to call us back.

## 2022-11-12 ENCOUNTER — Encounter: Payer: Self-pay | Admitting: *Deleted

## 2022-11-12 NOTE — Telephone Encounter (Signed)
I called Carol Walls with [employer] [phone] and left a message on her mobile number and home nunber stating I as calling with non-urgent information and please call us back. I will also send a letter per protocol. Nancy Fetter

## 2023-01-09 ENCOUNTER — Ambulatory Visit: Payer: BLUE CROSS/BLUE SHIELD

## 2023-01-14 ENCOUNTER — Ambulatory Visit: Payer: BLUE CROSS/BLUE SHIELD

## 2023-01-14 ENCOUNTER — Ambulatory Visit: Payer: BLUE CROSS/BLUE SHIELD | Admitting: *Deleted

## 2023-01-14 VITALS — BP 115/89 | HR 89 | Ht 66.0 in | Wt 182.7 lb

## 2023-01-14 DIAGNOSIS — Z3042 Encounter for surveillance of injectable contraceptive: Secondary | ICD-10-CM | POA: Diagnosis not present

## 2023-01-14 MED ORDER — MEDROXYPROGESTERONE ACETATE 150 MG/ML IM SUSY
150.0000 mg | PREFILLED_SYRINGE | Freq: Once | INTRAMUSCULAR | Status: AC
  Administered 2023-01-14: 150 mg via INTRAMUSCULAR

## 2023-01-14 NOTE — Progress Notes (Signed)
Here for depo provera

## 2023-03-04 ENCOUNTER — Ambulatory Visit (INDEPENDENT_AMBULATORY_CARE_PROVIDER_SITE_OTHER): Payer: BLUE CROSS/BLUE SHIELD | Admitting: Primary Care

## 2023-03-06 ENCOUNTER — Ambulatory Visit (INDEPENDENT_AMBULATORY_CARE_PROVIDER_SITE_OTHER): Payer: BLUE CROSS/BLUE SHIELD | Admitting: Primary Care

## 2023-03-28 ENCOUNTER — Ambulatory Visit: Payer: Medicaid Other | Admitting: Internal Medicine

## 2023-04-01 ENCOUNTER — Ambulatory Visit: Payer: Medicaid Other

## 2023-04-01 ENCOUNTER — Other Ambulatory Visit: Payer: Self-pay

## 2023-04-01 VITALS — BP 126/85 | HR 98 | Ht 66.0 in | Wt 184.1 lb

## 2023-04-01 DIAGNOSIS — Z3042 Encounter for surveillance of injectable contraceptive: Secondary | ICD-10-CM

## 2023-04-01 MED ORDER — MEDROXYPROGESTERONE ACETATE 150 MG/ML IM SUSY
150.0000 mg | PREFILLED_SYRINGE | Freq: Once | INTRAMUSCULAR | Status: AC
Start: 2023-04-01 — End: 2023-04-01
  Administered 2023-04-01: 150 mg via INTRAMUSCULAR

## 2023-04-01 NOTE — Progress Notes (Signed)
Rolene Course here for Depo-Provera Injection. Injection administered without complication. Patient will return in 3 months for next injection between May 5 and May 19. Next annual visit due 10/2023.   Ralene Bathe, RN 04/01/2023  2:03 PM

## 2023-06-19 ENCOUNTER — Ambulatory Visit: Payer: Medicaid Other | Admitting: *Deleted

## 2023-06-19 ENCOUNTER — Other Ambulatory Visit: Payer: Self-pay

## 2023-06-19 VITALS — BP 133/81 | HR 81 | Ht 66.0 in | Wt 178.4 lb

## 2023-06-19 DIAGNOSIS — Z3042 Encounter for surveillance of injectable contraceptive: Secondary | ICD-10-CM | POA: Diagnosis not present

## 2023-06-19 MED ORDER — MEDROXYPROGESTERONE ACETATE 150 MG/ML IM SUSP
150.0000 mg | Freq: Once | INTRAMUSCULAR | Status: AC
  Administered 2023-06-19: 150 mg via INTRAMUSCULAR

## 2023-06-19 NOTE — Progress Notes (Signed)
 Depo Provera  150 mg IM administered as scheduled.  Pt tolerated well.  Next injection due 7/23-8/6.  Next Annual exam due after 10/24/23. Pt states that she has been losing her hair while on Depo.  She was advised to schedule appt with provider in order to discuss. [place]. She plans to receive next depo injection after today and then discuss with provider @ Annual Gyn visit.

## 2023-09-16 ENCOUNTER — Other Ambulatory Visit: Payer: Self-pay

## 2023-09-16 ENCOUNTER — Ambulatory Visit: Admitting: *Deleted

## 2023-09-16 VITALS — BP 130/74 | HR 86 | Ht 66.0 in | Wt 175.4 lb

## 2023-09-16 DIAGNOSIS — Z3042 Encounter for surveillance of injectable contraceptive: Secondary | ICD-10-CM | POA: Diagnosis not present

## 2023-09-16 MED ORDER — MEDROXYPROGESTERONE ACETATE 150 MG/ML IM SUSY
150.0000 mg | PREFILLED_SYRINGE | Freq: Once | INTRAMUSCULAR | Status: AC
  Administered 2023-09-16: 150 mg via INTRAMUSCULAR

## 2023-09-16 NOTE — Progress Notes (Signed)
 Mickiel CHRISTELLA Meter here for Depo-Provera  Injection. Injection administered without complication. Patient will return in 3 months for next injection between 1020/25 and 12/16/23. Next annual visit due after 10/24/23. Requested diapers from [employer] market, given.  Rock Skip PEAK 09/16/2023  2:28 PM

## 2023-10-26 NOTE — Progress Notes (Signed)
   Subjective:     Carol Walls is a 43 y.o. female here at [facility]  for a routine exam.  Current complaints: none.  Personal and family health history reviewed: yes.  Do you have a primary care provider? No Do you feel safe at home? Yes  Flowsheet Row Office Visit from 10/24/2022 in [facility] [employer] [facility]  PHQ-2 Total Score 0    Health Maintenance Due  Topic Date Due   Mammogram  Never done   DTaP/Tdap/Td (1 - Tdap) Never done   Hepatitis B Vaccines 19-59 Average Risk (1 of 3 - 19+ 3-dose series) Never done   HPV VACCINES (1 - 3-dose SCDM series) Never done   Colonoscopy  Never done     Risk factors for chronic health problems: Smoking: Alchohol/how much: Pt BMI: Body mass index is 28.97 kg/m.   Gynecologic History No LMP recorded (lmp unknown). Patient has had an injection. Contraception: Depo-Provera  injections Last Pap: 2024. Results were: normal Last mammogram: never. Ordered and scheduled today.   Obstetric History OB History  Gravida Para Term Preterm AB Living  7 6 4 2 1 7   SAB IAB Ectopic Multiple Live Births  1 0 0 1 3    # Outcome Date GA Lbr Len/2nd Weight Sex Type Anes PTL Lv  7 Preterm 05/27/21 [redacted]w[redacted]d  6 lb 4.5 oz (2.85 kg) M CS-LTranv Spinal  LIV  6A Preterm 12/06/11 [redacted]w[redacted]d  4 lb 11.5 oz (2.14 kg) M CS-LVertical Spinal  LIV  6B Preterm 12/06/11 [redacted]w[redacted]d  5 lb 5.7 oz (2.43 kg) F CS-LVertical Spinal  LIV  5 Term 2010    F CS-Unspec     4 SAB 2009 [redacted]w[redacted]d         3 Term 2005    M Vag-Spont     2 Term 2002    M Vag-Spont     1 Term 1999    M Vag-Spont        The following portions of the patient's history were reviewed and updated as appropriate: allergies, current medications, past family history, past medical history, past social history, past surgical history, and problem list.  Review of Systems Pertinent items are noted in HPI.    Objective:   Today's Vitals   10/29/23 1349  BP: 114/82  Pulse: 93  Weight:  179 lb 8 oz (81.4 kg)  Height: 5' 6 (1.676 m)  PainSc: 0-No pain   Body mass index is 28.97 kg/m.  VS reviewed, nursing note reviewed,  Constitutional: well developed, well nourished, no distress HEENT: normocephalic, thyroid  without enlargement or mass HEART: RRR, no murmurs rubs/gallops RESP: clear and equal to auscultation bilaterally in all lobes  Breast Exam:  Deferred with low risks and shared decision making, discussed recommendation to start mammogram between 40-50 yo/  Abdomen: soft Neuro: alert and oriented x 3 Skin: warm, dry Psych: affect normal Pelvic exam: Deferred     Assessment/Plan:   1. Well woman exam (Primary) - Benign no concerns.  - Mammogram ordered and scheduled - Pap up to date due 2028.   2. Language barrier affecting health care - Interpreter offered and declined.    Return in about 1 year (around 10/28/2024), or to discuss hormone replacement therapy, for Annual.   Camie DELENA Rote, CNM 2:14 PM

## 2023-10-29 ENCOUNTER — Encounter: Payer: Self-pay | Admitting: Certified Nurse Midwife

## 2023-10-29 ENCOUNTER — Ambulatory Visit: Admitting: Certified Nurse Midwife

## 2023-10-29 VITALS — BP 114/82 | HR 93 | Ht 66.0 in | Wt 179.5 lb

## 2023-10-29 DIAGNOSIS — Z758 Other problems related to medical facilities and other health care: Secondary | ICD-10-CM | POA: Diagnosis not present

## 2023-10-29 DIAGNOSIS — Z01419 Encounter for gynecological examination (general) (routine) without abnormal findings: Secondary | ICD-10-CM | POA: Diagnosis not present

## 2023-10-29 DIAGNOSIS — Z1239 Encounter for other screening for malignant neoplasm of breast: Secondary | ICD-10-CM

## 2023-10-29 DIAGNOSIS — Z603 Acculturation difficulty: Secondary | ICD-10-CM

## 2023-11-13 ENCOUNTER — Ambulatory Visit

## 2023-12-02 ENCOUNTER — Other Ambulatory Visit: Payer: Self-pay

## 2023-12-02 ENCOUNTER — Ambulatory Visit (INDEPENDENT_AMBULATORY_CARE_PROVIDER_SITE_OTHER)

## 2023-12-02 VITALS — BP 117/82 | HR 90 | Ht 66.0 in | Wt 180.0 lb

## 2023-12-02 DIAGNOSIS — Z3042 Encounter for surveillance of injectable contraceptive: Secondary | ICD-10-CM

## 2023-12-02 MED ORDER — MEDROXYPROGESTERONE ACETATE 150 MG/ML IM SUSP
150.0000 mg | Freq: Once | INTRAMUSCULAR | Status: AC
  Administered 2023-12-02: 150 mg via INTRAMUSCULAR

## 2023-12-02 NOTE — Progress Notes (Signed)
 Mickiel CHRISTELLA Meter here for Depo-Provera  Injection. Last dose on 09/16/23. Injection administered without complication. Patient will return in 3 months for next injection between January 5th and January 19th. Next annual visit due 10/2024.   Rosaline Pendleton, RN 12/02/2023  3:12 PM

## 2024-02-17 ENCOUNTER — Ambulatory Visit

## 2024-02-17 VITALS — BP 136/77 | HR 94 | Ht 66.0 in | Wt 190.9 lb

## 2024-02-17 DIAGNOSIS — Z3042 Encounter for surveillance of injectable contraceptive: Secondary | ICD-10-CM | POA: Diagnosis not present

## 2024-02-17 MED ORDER — MEDROXYPROGESTERONE ACETATE 150 MG/ML IM SUSY
150.0000 mg | PREFILLED_SYRINGE | Freq: Once | INTRAMUSCULAR | Status: AC
  Administered 2024-02-17: 150 mg via INTRAMUSCULAR

## 2024-02-17 NOTE — Progress Notes (Signed)
 Carol Walls  Injection. Last injection 12/02/23. Today Injection administered without complication. Patient will return in 3 months for next injection between 05/04/24 and 05/18/24. Next annual visit due 10/2024.   Shanira Tine,RN 02/17/2024

## 2024-05-04 ENCOUNTER — Ambulatory Visit: Payer: Self-pay
